# Patient Record
Sex: Female | Born: 1975 | Race: Black or African American | Hispanic: No | Marital: Married | Smoking: Never smoker
Health system: Southern US, Community
[De-identification: ages and names within clinical notes are randomized; demographics above are authoritative.]

## PROBLEM LIST (undated history)

## (undated) DIAGNOSIS — R011 Cardiac murmur, unspecified: Secondary | ICD-10-CM

## (undated) DIAGNOSIS — IMO0001 Reserved for inherently not codable concepts without codable children: Secondary | ICD-10-CM

## (undated) DIAGNOSIS — D869 Sarcoidosis, unspecified: Secondary | ICD-10-CM

## (undated) DIAGNOSIS — I10 Essential (primary) hypertension: Secondary | ICD-10-CM

## (undated) DIAGNOSIS — E039 Hypothyroidism, unspecified: Secondary | ICD-10-CM

## (undated) DIAGNOSIS — E049 Nontoxic goiter, unspecified: Secondary | ICD-10-CM

## (undated) DIAGNOSIS — R896 Abnormal cytological findings in specimens from other organs, systems and tissues: Secondary | ICD-10-CM

## (undated) DIAGNOSIS — D649 Anemia, unspecified: Secondary | ICD-10-CM

## (undated) HISTORY — DX: Anemia, unspecified: D64.9

## (undated) HISTORY — DX: Sarcoidosis, unspecified: D86.9

## (undated) HISTORY — DX: Abnormal cytological findings in specimens from other organs, systems and tissues: R89.6

## (undated) HISTORY — DX: Nontoxic goiter, unspecified: E04.9

## (undated) HISTORY — PX: COLPOSCOPY: SHX161

## (undated) HISTORY — DX: Reserved for inherently not codable concepts without codable children: IMO0001

## (undated) HISTORY — DX: Cardiac murmur, unspecified: R01.1

## (undated) HISTORY — DX: Hypothyroidism, unspecified: E03.9

## (undated) HISTORY — PX: THYROID SURGERY: SHX805

## (undated) HISTORY — PX: TOOTH EXTRACTION: SUR596

---

## 2002-08-04 HISTORY — PX: THYROID SURGERY: SHX805

## 2002-09-04 DIAGNOSIS — C801 Malignant (primary) neoplasm, unspecified: Secondary | ICD-10-CM

## 2002-09-04 HISTORY — DX: Malignant (primary) neoplasm, unspecified: C80.1

## 2002-11-16 ENCOUNTER — Ambulatory Visit (HOSPITAL_COMMUNITY): Admission: RE | Admit: 2002-11-16 | Discharge: 2002-11-16 | Payer: Self-pay | Admitting: Obstetrics and Gynecology

## 2002-11-16 ENCOUNTER — Encounter: Payer: Self-pay | Admitting: Obstetrics and Gynecology

## 2006-12-07 ENCOUNTER — Other Ambulatory Visit: Admission: RE | Admit: 2006-12-07 | Discharge: 2006-12-07 | Payer: Self-pay | Admitting: Gynecology

## 2007-03-04 ENCOUNTER — Emergency Department (HOSPITAL_COMMUNITY): Admission: EM | Admit: 2007-03-04 | Discharge: 2007-03-04 | Payer: Self-pay | Admitting: Emergency Medicine

## 2007-03-11 ENCOUNTER — Ambulatory Visit: Payer: Self-pay | Admitting: Orthopedic Surgery

## 2007-04-08 ENCOUNTER — Ambulatory Visit: Payer: Self-pay | Admitting: Orthopedic Surgery

## 2007-04-20 ENCOUNTER — Ambulatory Visit: Payer: Self-pay | Admitting: Orthopedic Surgery

## 2007-04-20 ENCOUNTER — Other Ambulatory Visit: Admission: RE | Admit: 2007-04-20 | Discharge: 2007-04-20 | Payer: Self-pay | Admitting: Gynecology

## 2007-06-03 ENCOUNTER — Ambulatory Visit: Payer: Self-pay | Admitting: Orthopedic Surgery

## 2007-06-03 DIAGNOSIS — S93409A Sprain of unspecified ligament of unspecified ankle, initial encounter: Secondary | ICD-10-CM | POA: Insufficient documentation

## 2007-06-22 ENCOUNTER — Encounter: Payer: Self-pay | Admitting: Orthopedic Surgery

## 2007-10-07 ENCOUNTER — Other Ambulatory Visit: Admission: RE | Admit: 2007-10-07 | Discharge: 2007-10-07 | Payer: Self-pay | Admitting: Gynecology

## 2008-03-15 ENCOUNTER — Other Ambulatory Visit: Admission: RE | Admit: 2008-03-15 | Discharge: 2008-03-15 | Payer: Self-pay | Admitting: Obstetrics and Gynecology

## 2008-07-22 ENCOUNTER — Inpatient Hospital Stay (HOSPITAL_COMMUNITY): Admission: AD | Admit: 2008-07-22 | Discharge: 2008-07-22 | Payer: Self-pay | Admitting: Obstetrics & Gynecology

## 2008-09-13 ENCOUNTER — Inpatient Hospital Stay (HOSPITAL_COMMUNITY): Admission: AD | Admit: 2008-09-13 | Discharge: 2008-09-13 | Payer: Self-pay | Admitting: Obstetrics & Gynecology

## 2008-09-14 ENCOUNTER — Encounter: Payer: Self-pay | Admitting: Obstetrics & Gynecology

## 2008-09-14 ENCOUNTER — Ambulatory Visit: Payer: Self-pay | Admitting: Family Medicine

## 2008-09-14 ENCOUNTER — Inpatient Hospital Stay (HOSPITAL_COMMUNITY): Admission: AD | Admit: 2008-09-14 | Discharge: 2008-09-17 | Payer: Self-pay | Admitting: Obstetrics & Gynecology

## 2010-11-19 LAB — RPR: RPR Ser Ql: NONREACTIVE

## 2010-11-19 LAB — CBC
HCT: 27.1 % — ABNORMAL LOW (ref 36.0–46.0)
MCHC: 33.6 g/dL (ref 30.0–36.0)
MCV: 87.8 fL (ref 78.0–100.0)
MCV: 88.1 fL (ref 78.0–100.0)
Platelets: 225 10*3/uL (ref 150–400)
RBC: 3.94 MIL/uL (ref 3.87–5.11)
RDW: 15.6 % — ABNORMAL HIGH (ref 11.5–15.5)

## 2010-12-17 NOTE — Discharge Summary (Signed)
Breanna Lucero, Breanna Lucero                ACCOUNT NO.:  [id number]   MEDICAL RECORD NO.:  [id number]          PATIENT TYPE:  INP   LOCATION:  9142                          FACILITY:  [facility]   PHYSICIAN:  Norton Blizzard, MD    DATE OF BIRTH:  July 27, 1976   DATE OF ADMISSION:  09/14/2008  DATE OF DISCHARGE:  09/17/2008                               DISCHARGE SUMMARY   REASON FOR ADMISSION:  Pregnancy at term and labor.   POSTOPERATIVE DIAGNOSES:  Cesarean section for nonreassuring fetal heart  rate pattern, myomectomy for 9-cm uterine fibroids, and lysis of pelvic  adhesions per Dr. Silas Flood and Dr. Noel Gerold on September 14, 2008.   HOSPITAL COURSE:  This has essentially been uneventful.  The patient has  progressed well without complications.  Vital signs have remained  stable.  She is ambulating well, passing flatus, tolerating regular diet  without complications, and voiding without difficulty.   DISCHARGE PHYSICAL EXAMINATION:  VITAL SIGNS:  AFVSS.  HEART:  Regular rhythm and rate.  LUNGS:  Clear to auscultation bilaterally.  ABDOMEN:  Soft and nontender.  Bowel sounds are present x4 quadrants.  Fundus is firm.  Small amount of lochia .  EXTREMITIES:  Trace edema in the lower extremities.   DISCHARGE CONDITION:  Stable on postop day #3.   DISCHARGE MEDICATIONS:  1. Chromagen Forte b.i.d. for hemoglobin of 9.9.  2. Motrin 600 q.6 h. p.r.n. cramping.  3. Lortab 5/325 one p.o. q.4 h. pain.   FOLLOWUP:  She is to follow up on Tuesday at [facility] for staple  removal and  in 6 weeks for postpartum exam.  She desires IUD as  postpartum contraceptive, and this will be placed at her postpartum  visit.      Zerita Boers, N.M.      Norton Blizzard, MD  Electronically Signed   DL/MEDQ  D:  16/05/9603  T:  09/17/2008  Job:  [id number]   cc:   [facility]

## 2010-12-17 NOTE — Op Note (Signed)
Breanna Lucero, Breanna Lucero                ACCOUNT NO.:  [id number]   MEDICAL RECORD NO.:  [id number]          PATIENT TYPE:  INP   LOCATION:  9142                          FACILITY:  [facility]   PHYSICIAN:  Norton Blizzard, MD    DATE OF BIRTH:  08/02/1976   DATE OF PROCEDURE:  09/14/2008  DATE OF DISCHARGE:                               OPERATIVE REPORT   PREOPERATIVE DIAGNOSES:  1. Intrauterine pregnancy at term.  2. History of previous cesarean section.  3. Nonreassuring fetal heart tracing.   POSTOPERATIVE DIAGNOSES:  1. Intrauterine pregnancy at term.  2. History of previous cesarean section.  3. Nonreassuring fetal heart tracing.  4. Partial rupture of posterior uterine wall.  5. A 9-cm uterine fibroid.  6. Pelvic adhesive disease.   PROCEDURES:  1. Repeat low-transverse cesarean section.  2. Myomectomy.   SURGEON:  Norton Blizzard, MD   ASSISTANT:  Odie Sera, DO   ANESTHESIA:  Epidural.   INDICATIONS FOR PROCEDURE:  [name] is a 35 year old now  gravida 4, para 1-2-1-3, who was admitted at 37-5/7th weeks gestational  age in active labor.  She has a history of a previous cesarean section  followed by a successful vaginal birth after cesarean.  She desired to  do a vaginal birth after cesarean.  Her labor progressed.  However, as  she dilated to 6 cm, she was noted to have some moderate vaginal  bleeding.  Shortly thereafter, a prolonged variable deceleration was  noted, it lasted approximately 5 minutes.  The heart rate then recovered  and looked reassuring for a while.  However, as the contractions  progressed, the baby sustained repetitive prolonged decelerations down  to the 50s to 60s that lasted approximately 1-2 minutes with each  contraction.  During this time, there was no further cervical dilation  and after approximately 2 hours at 6 cm, the patient was counseled on a  repeat cesarean section.  The risks and benefits of this procedure were  explained to  the patient to include, but not limited to bleeding,  infection, damage to internal organs, need for additional procedures and  potentially could require a hysterectomy.  The patient voiced  understanding of these instructions and desired to proceed with the  surgery.   DESCRIPTION OF PROCEDURE:  The patient was taken to the operative room  and her epidural anesthesia was re-bolused.  She was then prepped and  draped in the usual sterile manner and placed in the left dorsal supine  position.  A time-out was conducted.  Appropriate anesthesia was  confirmed.  A Pfannenstiel incision was made in the skin and extended  through the subcutaneous fat to the underlying fascia.  The fascia was  then incised in the midline and the fascial incision was extended using  Mayo scissors.  The fascia was then dissected off the underlying rectus  muscles, both bluntly and sharply with the Mayo scissors.  The rectus  muscles were then separated in the midline and the opening was extended  using manual traction.  The peritoneum was entered bluntly and the  peritoneal opening was also extended using manual traction.  The rectus  muscles were further dissected, first bluntly and then sharply in the  midline, and there was some dissection with the Bovie on the left rectus  muscle approximately the mid position one third of the way over  transversely.  The appropriate opening to the uterus was obtained.  The  bladder blade was placed, and a transverse incision was made in the  lower uterine segment with the scalpel.  This incision was carried  through the layers of myometrium with the last layer penetrated bluntly  with a finger.  The uterine incision was extended, a moderate amount of  blood and some blood clot was noted coming from the uterus.  The fetal  head was easily grasped and elevated out of the pelvis.  Bladder blade  was removed, and a fetal head delivered with assistance of a small  amount of  fundal pressure.  The mouth and nares were both bulb  suctioned.  Then the shoulders followed by rest of the corpus were  delivered in the usual manner.  The mouth and nares were both bulb  suctioned again.  The baby had a spontaneous cry.  The cord was clamped  and cut x2, and the baby was handed to the awaiting NICU staff.  The  placenta then delivered spontaneously with the assistance of fundal  massage.  The placenta was intact in the three-vessel cord.  A cord pH  was collected, which was 7.27.  The uterus was then cleared of clots and  debris with a dry lap sponge.  The uterine incision was inspected and  there was found to be a moderate-sized portion of the posterior uterine  wall that had torn from the inside outward, from the endometrium to more  than half of the myometrium was involved.  This partial ruptured area  was sutured back down to the posterior uterine wall with 0 Monocryl  suture.  The uterine incision was then closed in a two-layer closure  using the same suture.  The first layer was a running interlocking  stitch.  The second layer was an imbricating layer.  Good hemostasis was  noted of the uterine incision.  While attempting to palpate the adnexae,  a large uterine mass was noted on the superior aspect of the uterus.  The uterus was then exteriorized and a 9-cm uterine fibroid mass was  noted off the superior-anterior aspect of the uterus.  There was some  thin adhesions involving bowel and the fibroid.  The adhesed bowel was  gently separated using blunt dissection.  There was also two omentum  adhesions which were clamped with Kelly clamps, cut and then suture  ligated with 0 Vicryl.  The stalk of the fibroid was then grasped with  two Kelly clamps and the mass was removed using electrocautery.  Two 0  Vicryl Heaney sutures were placed for hemostasis and the Kelly clamps  were removed.  Some small amount of bleeding persisted and some 2-0  Vicryl was used to over  stitch the serosa.  Good hemostasis was noted  and the uterus returned to the abdomen.  A layer of Surgicel was placed  over the area where the fibroid was removed.  The rectus muscles and  peritoneum was approximated using interrupted sutures of 0 Vicryl.  The  fascia was then closed with 0 PDS suture in a running non-interlocking  fashion.  Good hemostasis was noted.  There were no defects noted  in the  fascia.  The subcutaneous tissues were irrigated with a wet lap sponge.  The skin was then closed using staples in the usual manner.  A pressure  dressing was applied.  The vaginal exam was done, clots removed from the  vagina, and a good passageway into the uterus was noted.   FINDINGS:  1. Viable female infant, Apgars were 9 and 9, cord pH was 7.27.  2. A 9-cm uterine mass  3. Bilateral fallopian tubes and ovaries grossly normal.   SPECIMEN:  1. Placenta.  2. Uterine fibroid.   DISPOSITION OF SPECIMEN:  Pathology.   ESTIMATED BLOOD LOSS:  300 mL.   COMPLICATIONS:  There were no immediate complications.   CONDITION/DISPOSITION:  The patient was taken to the PACU in stable  condition.      Odie Sera, DO  Electronically Signed     ______________________________  Norton Blizzard, MD    MC/MEDQ  D:  09/14/2008  T:  09/15/2008  Job:  [id number]

## 2011-03-03 ENCOUNTER — Encounter: Payer: Self-pay | Admitting: *Deleted

## 2011-03-05 ENCOUNTER — Ambulatory Visit: Payer: Self-pay | Admitting: Gynecology

## 2011-04-08 ENCOUNTER — Other Ambulatory Visit (HOSPITAL_COMMUNITY)
Admission: RE | Admit: 2011-04-08 | Discharge: 2011-04-08 | Disposition: A | Discharge status: Home / Self Care | Payer: 59 | Source: Ambulatory Visit | Attending: Obstetrics & Gynecology | Admitting: Obstetrics & Gynecology

## 2011-04-08 ENCOUNTER — Other Ambulatory Visit: Payer: Self-pay | Admitting: Obstetrics & Gynecology

## 2011-04-08 DIAGNOSIS — Z01419 Encounter for gynecological examination (general) (routine) without abnormal findings: Secondary | ICD-10-CM | POA: Insufficient documentation

## 2011-05-09 LAB — WET PREP, GENITAL

## 2011-09-23 ENCOUNTER — Emergency Department (HOSPITAL_COMMUNITY): Payer: 59

## 2011-09-23 ENCOUNTER — Other Ambulatory Visit: Payer: Self-pay

## 2011-09-23 ENCOUNTER — Emergency Department (HOSPITAL_COMMUNITY)
Admission: EM | Admit: 2011-09-23 | Discharge: 2011-09-23 | Disposition: A | Discharge status: Home / Self Care | Payer: 59 | Attending: Emergency Medicine | Admitting: Emergency Medicine

## 2011-09-23 ENCOUNTER — Encounter (HOSPITAL_COMMUNITY): Payer: Self-pay | Admitting: *Deleted

## 2011-09-23 DIAGNOSIS — R030 Elevated blood-pressure reading, without diagnosis of hypertension: Secondary | ICD-10-CM | POA: Insufficient documentation

## 2011-09-23 DIAGNOSIS — R079 Chest pain, unspecified: Secondary | ICD-10-CM | POA: Insufficient documentation

## 2011-09-23 DIAGNOSIS — G44209 Tension-type headache, unspecified, not intractable: Secondary | ICD-10-CM

## 2011-09-23 DIAGNOSIS — E039 Hypothyroidism, unspecified: Secondary | ICD-10-CM | POA: Insufficient documentation

## 2011-09-23 MED ORDER — METOCLOPRAMIDE HCL 5 MG/ML IJ SOLN
10.0000 mg | Freq: Once | INTRAMUSCULAR | Status: AC
  Administered 2011-09-23: 10 mg via INTRAVENOUS
  Filled 2011-09-23: qty 2

## 2011-09-23 MED ORDER — METOCLOPRAMIDE HCL 10 MG PO TABS: 10.0000 mg | ORAL_TABLET | Freq: Four times a day (QID) | ORAL | Status: DC | PRN

## 2011-09-23 MED ORDER — DIPHENHYDRAMINE HCL 50 MG/ML IJ SOLN
25.0000 mg | Freq: Once | INTRAMUSCULAR | Status: AC
  Administered 2011-09-23: 03:00:00 via INTRAVENOUS
  Filled 2011-09-23: qty 1

## 2011-09-23 MED ORDER — SODIUM CHLORIDE 0.9 % IV BOLUS (SEPSIS)
1000.0000 mL | Freq: Once | INTRAVENOUS | Status: AC
  Administered 2011-09-23: 1000 mL via INTRAVENOUS

## 2011-09-23 NOTE — ED Provider Notes (Signed)
History     CSN: [id number]  Arrival date & time 09/23/11  1324   First MD Initiated Contact with Patient 09/23/11 0232      Chief Complaint  Patient presents with  . Headache  . Hypertension  . Nausea    (Consider location/radiation/quality/duration/timing/severity/associated sxs/prior treatment) Patient is a 36 y.o. female presenting with headaches and hypertension. The history is provided by the patient.  Headache   Hypertension Associated symptoms include headaches.  She started having a left occipital headache this evening. The pain was dull of but severe and she rated it at 10 out of 10. She took a dose of naproxen and that reduced the pain to 6/10. There is no associated nausea, vomiting. She denies photophobia or phonophobia. Pain was worse if she turned her head a certain way. She took her blood pressure and it was elevated at 170/110. She states her blood pressures normally not elevated. She denies tinnitus. She denies visual changes. She also is concerned that she's had a head cold and she's been sneezing a lot and when she sneezes, there is a burning sensation that goes through her chest. She denies dyspnea and denies fever.  Past Medical History  Diagnosis Date  . Hypothyroid   . Goiter   . ASCUS (atypical squamous cells of undetermined significance) on Pap smear     neg high risk hpv    Past Surgical History  Procedure Date  . Cesarean section   . Thyroid surgery   . Colposcopy     Family History  Problem Relation Age of Onset  . Hypertension Mother   . Heart disease Father     History  Substance Use Topics  . Smoking status: Never Smoker   . Smokeless tobacco: Not on file  . Alcohol Use: No    OB History    Grav Para Term Preterm Abortions TAB SAB Ect Mult Living                  Review of Systems  Neurological: Positive for headaches.  All other systems reviewed and are negative.    Allergies  Review of patient's allergies indicates no  known allergies.  Home Medications   Current Outpatient Rx  Name Route Sig Dispense Refill  . IBUPROFEN 400 MG PO TABS Oral Take 400 mg by mouth every 6 (six) hours as needed. As needed    . SYNTHROID PO Oral Take 200 mg by mouth daily.       BP 154/88  Pulse 72  Temp(Src) 97.9 F (36.6 C) (Oral)  Resp 18  Ht 5\' 4"  (1.626 m)  Wt 205 lb (92.987 kg)  BMI 35.19 kg/m2  SpO2 100%  Physical Exam  Nursing note and vitals reviewed.  36 year old female is resting comfortably in no acute distress. Vital signs are significant for mild hypertension with blood pressure 154/88. Oxygen saturation is 100% which is normal. Head is normocephalic and atraumatic. PERRLA, EOMI. Fundi show no hemorrhage or exudate or papilledema. Oropharynx is clear. There is tenderness to palpation at the insertion of the left paracervical muscles which does reproduce her headache. Neck is supple and there is no adenopathy. Back is nontender. Lungs are clear without rales, wheezes, or rhonchi. Heart has regular rate rhythm without murmur. Abdomen is soft, flat, nontender without masses or hepatosplenomegaly. Extremities have no cyanosis or edema, full range of motion is present. Skin is warm and dry without rash. Neurologic: Mental status is normal, cranial nerves are intact, there no  focal motor or sensory deficits.  ED Course  Procedures (including critical care time)  Dg Chest 2 View  09/23/2011  *RADIOLOGY REPORT*  Clinical Data: Chest pain; sneezing.  Hypertension.  CHEST - 2 VIEW  Comparison: None.  Findings: The lungs are well-aerated and clear.  There is no evidence of focal opacification, pleural effusion or pneumothorax.  The heart is borderline normal in size; the mediastinal contour is within normal limits.  No acute osseous abnormalities are seen.  IMPRESSION: No acute cardiopulmonary process seen.  Original Report Authenticated By: Tonia Ghent, M.D.    ECG shows normal sinus rhythm with a rate of 74, no  ectopy. Normal axis. Normal P wave. Normal QRS. Normal intervals. Normal ST and T waves. Impression: normal ECG. No prior ECG available for comparison.  She had good relief of her headache with IV fluids, IV metoclopramide, and IV diphenhydramine.  1. Muscle contraction headache       MDM  Headache is most likely muscle contraction headache. Hypertension is of uncertain significance it may just be a response to pain. She will be given a headache cocktail and reassess. She's concerned about her chest pain which I do believe this is mucosal irritation, but chest x-ray and EKG will be obtained.        Dione Booze, MD 09/23/11 [phone]

## 2011-09-23 NOTE — ED Notes (Signed)
pt reports ha & nausea & states blood pressure is up also.

## 2011-09-23 NOTE — Discharge Instructions (Signed)
Check your blood pressure several times over the next week. You did is staying elevated, then talk with your doctor about whether you need to be on medication for your blood pressure. If it comes back to normal, then just have it checked periodically to make sure it's not getting high.  Tension Headache (Muscle Contraction Headache) Tension headache is one of the most common causes of head pain. These headaches are usually felt as a pain over the top of your head and back of your neck. Stress, anxiety, and depression are common triggers for these headaches. Tension headaches are not life-threatening and will not lead to other types of headaches. Tension headaches can often be diagnosed by taking a history from the patient and a physical exam. Sometimes, further lab and x-ray studies are used to confirm the diagnosis. Your caregiver can advise you on how to get help solving problems that cause anxiety or stress. Antidepressants can be prescribed if depression is a problem. HOME CARE INSTRUCTIONS   If testing was done, call for your results. Remember, it is your responsibility to get the results of all testing. Do not assume everything is fine because you do not hear from your caregiver.   Only take over-the-counter or prescription medicines for pain, discomfort, or fever as directed by your caregiver.   Biofeedback, massage, or other relaxation techniques may be helpful.   Ice packs or heat to the head and neck can be used. Use these three to four times per day or as needed.   Physical therapy may be a useful addition to treatment.   If headaches continue, even with therapy, you may need to think about lifestyle changes.   Avoid excessive use of pain killers, as rebound headaches can occur.  SEEK MEDICAL CARE IF:   You develop problems with medications prescribed.   You do not respond or get no relief from medications.   You have a change from the usual headache.   You develop nausea  (feeling sick to your stomach) or vomiting.  SEEK IMMEDIATE MEDICAL CARE IF:   Your headache becomes severe.   You have an unexplained oral temperature above 102 F (38.9 C).   You develop a stiff neck.   You have loss of vision.   You have muscular weakness.   You have loss of muscular control.   You develop severe symptoms different from your first symptoms.   You start losing your balance or have trouble walking.   You feel faint or pass out.  MAKE SURE YOU:   Understand these instructions.   Will watch your condition.   Will get help right away if you are not doing well or get worse.  Document Released: 07/21/2005 Document Revised: 04/02/2011 Document Reviewed: 03/09/2008 [facility] Patient Information 2012 [place], [place].  Metoclopramide tablets What is this medicine? METOCLOPRAMIDE (met oh kloe PRA mide) is used to treat the symptoms of gastroesophageal reflux disease (GERD) like heartburn. It is also used to treat people with slow emptying of the stomach and intestinal tract. This medicine may be used for other purposes; ask your health care provider or pharmacist if you have questions. What should I tell my health care provider before I take this medicine? They need to know if you have any of these conditions: -breast cancer -depression -diabetes -heart failure -high blood pressure -kidney disease -liver disease -Parkinson's disease or a movement disorder -pheochromocytoma -seizures -stomach obstruction, bleeding, or perforation -an unusual or allergic reaction to metoclopramide, procainamide, sulfites, other medicines,  foods, dyes, or preservatives -pregnant or trying to get pregnant -breast-feeding How should I use this medicine? Take this medicine by mouth with a glass of water. Follow the directions on the prescription label. Take this medicine on an empty stomach, about 30 minutes before eating. Take your doses at regular intervals. Do not take your  medicine more often than directed. Do not stop taking except on the advice of your doctor or health care professional. A special MedGuide will be given to you by the pharmacist with each prescription and refill. Be sure to read this information carefully each time. Talk to your pediatrician regarding the use of this medicine in children. Special care may be needed. Overdosage: If you think you have taken too much of this medicine contact a poison control center or emergency room at once. NOTE: This medicine is only for you. Do not share this medicine with others. What if I miss a dose? If you miss a dose, take it as soon as you can. If it is almost time for your next dose, take only that dose. Do not take double or extra doses. What may interact with this medicine? -acetaminophen -cyclosporine -digoxin -medicines for blood pressure -medicines for diabetes, including insulin -medicines for hay fever and other allergies -medicines for depression, especially an Monoamine Oxidase Inhibitor (MAOI) -medicines for Parkinson's disease, like levodopa -medicines for sleep or for pain -tetracycline This list may not describe all possible interactions. Give your health care provider a list of all the medicines, herbs, non-prescription drugs, or dietary supplements you use. Also tell them if you smoke, drink alcohol, or use illegal drugs. Some items may interact with your medicine. What should I watch for while using this medicine? It may take a few weeks for your stomach condition to start to get better. However, do not take this medicine for longer than 12 weeks. The longer you take this medicine, and the more you take it, the greater your chances are of developing serious side effects. If you are an elderly patient, a female patient, or you have diabetes, you may be at an increased risk for side effects from this medicine. Contact your doctor immediately if you start having movements you cannot control  such as lip smacking, rapid movements of the tongue, involuntary or uncontrollable movements of the eyes, head, arms and legs, or muscle twitches and spasms. Patients and their families should watch out for worsening depression or thoughts of suicide. Also watch out for any sudden or severe changes in feelings such as feeling anxious, agitated, panicky, irritable, hostile, aggressive, impulsive, severely restless, overly excited and hyperactive, or not being able to sleep. If this happens, especially at the beginning of treatment or after a change in dose, call your doctor. Do not treat yourself for high fever. Ask your doctor or health care professional for advice. You may get drowsy or dizzy. Do not drive, use machinery, or do anything that needs mental alertness until you know how this drug affects you. Do not stand or sit up quickly, especially if you are an older patient. This reduces the risk of dizzy or fainting spells. Alcohol can make you more drowsy and dizzy. Avoid alcoholic drinks. What side effects may I notice from receiving this medicine? Side effects that you should report to your doctor or health care professional as soon as possible: -allergic reactions like skin rash, itching or hives, swelling of the face, lips, or tongue -abnormal production of milk in females -breast enlargement in both  males and females -change in the way you walk -difficulty moving, speaking or swallowing -drooling, lip smacking, or rapid movements of the tongue -excessive sweating -fever -involuntary or uncontrollable movements of the eyes, head, arms and legs -irregular heartbeat or palpitations -muscle twitches and spasms -unusually weak or tired Side effects that usually do not require medical attention (report to your doctor or health care professional if they continue or are bothersome): -change in sex drive or performance -depressed mood -diarrhea -difficulty sleeping -headache -menstrual  changes -restless or nervous This list may not describe all possible side effects. Call your doctor for medical advice about side effects. You may report side effects to FDA at 1-800-FDA-1088. Where should I keep my medicine? Keep out of the reach of children. Store at room temperature between 20 and 25 degrees C (68 and 77 degrees F). Protect from light. Keep container tightly closed. Throw away any unused medicine after the expiration date. NOTE: This sheet is a summary. It may not cover all possible information. If you have questions about this medicine, talk to your doctor, pharmacist, or health care provider.  2012, [employer]. (03/15/2008 4:30:05 PM)

## 2012-06-28 ENCOUNTER — Other Ambulatory Visit: Payer: Self-pay | Admitting: Adult Health

## 2012-06-28 ENCOUNTER — Other Ambulatory Visit (HOSPITAL_COMMUNITY)
Admission: RE | Admit: 2012-06-28 | Discharge: 2012-06-28 | Disposition: A | Discharge status: Home / Self Care | Payer: 59 | Source: Ambulatory Visit | Attending: Obstetrics and Gynecology | Admitting: Obstetrics and Gynecology

## 2012-06-28 DIAGNOSIS — Z1151 Encounter for screening for human papillomavirus (HPV): Secondary | ICD-10-CM | POA: Insufficient documentation

## 2012-06-28 DIAGNOSIS — Z01419 Encounter for gynecological examination (general) (routine) without abnormal findings: Secondary | ICD-10-CM | POA: Insufficient documentation

## 2012-11-11 ENCOUNTER — Encounter: Payer: Self-pay | Admitting: *Deleted

## 2012-11-11 DIAGNOSIS — D649 Anemia, unspecified: Secondary | ICD-10-CM

## 2012-11-11 DIAGNOSIS — E039 Hypothyroidism, unspecified: Secondary | ICD-10-CM

## 2012-11-11 DIAGNOSIS — Z87898 Personal history of other specified conditions: Secondary | ICD-10-CM

## 2012-11-11 DIAGNOSIS — R011 Cardiac murmur, unspecified: Secondary | ICD-10-CM

## 2012-11-12 ENCOUNTER — Ambulatory Visit (INDEPENDENT_AMBULATORY_CARE_PROVIDER_SITE_OTHER): Payer: 59 | Admitting: Obstetrics & Gynecology

## 2012-11-12 ENCOUNTER — Encounter: Payer: Self-pay | Admitting: Obstetrics & Gynecology

## 2012-11-12 VITALS — BP 132/92 | Ht 64.0 in | Wt 190.0 lb

## 2012-11-12 DIAGNOSIS — N939 Abnormal uterine and vaginal bleeding, unspecified: Secondary | ICD-10-CM

## 2012-11-12 DIAGNOSIS — N949 Unspecified condition associated with female genital organs and menstrual cycle: Secondary | ICD-10-CM

## 2012-11-12 MED ORDER — MEGESTROL ACETATE 40 MG PO TABS: 40.0000 mg | ORAL_TABLET | Freq: Every day | ORAL | Status: DC

## 2012-11-12 NOTE — Patient Instructions (Signed)
Dysfunctional Uterine Bleeding  Normally, menstrual periods begin between ages 11 to 17 in young women. A normal menstrual cycle/period may begin every 23 days up to 35 days and lasts from 1 to 7 days. Around 12 to 14 days before your menstrual period starts, ovulation (ovary produces an egg) occurs. When counting the time between menstrual periods, count from the first day of bleeding of the previous period to the first day of bleeding of the next period.  Dysfunctional (abnormal) uterine bleeding is bleeding that is different from a normal menstrual period. Your periods may come earlier or later than usual. They may be lighter, have blood clots or be heavier. You may have bleeding between periods, or you may skip one period or more. You may have bleeding after sexual intercourse, bleeding after menopause, or no menstrual period.  CAUSES   · Pregnancy (normal, miscarriage, tubal).  · IUDs (intrauterine device, birth control).  · Birth control pills.  · Hormone treatment.  · Menopause.  · Infection of the cervix.  · Blood clotting problems.  · Infection of the inside lining of the uterus.  · Endometriosis, inside lining of the uterus growing in the pelvis and other female organs.  · Adhesions (scar tissue) inside the uterus.  · Obesity or severe weight loss.  · Uterine polyps inside the uterus.  · Cancer of the vagina, cervix, or uterus.  · Ovarian cysts or polycystic ovary syndrome.  · Medical problems (diabetes, thyroid disease).  · Uterine fibroids (noncancerous tumor).  · Problems with your female hormones.  · Endometrial hyperplasia, very thick lining and enlarged cells inside of the uterus.  · Medicines that interfere with ovulation.  · Radiation to the pelvis or abdomen.  · Chemotherapy.  DIAGNOSIS   · Your doctor will discuss the history of your menstrual periods, medicines you are taking, changes in your weight, stress in your life, and any medical problems you may have.  · Your doctor will do a physical  and pelvic examination.  · Your doctor may want to perform certain tests to make a diagnosis, such as:  · Pap test.  · Blood tests.  · Cultures for infection.  · CT scan.  · Ultrasound.  · Hysteroscopy.  · Laparoscopy.  · MRI.  · Hysterosalpingography.  · D and C.  · Endometrial biopsy.  TREATMENT   Treatment will depend on the cause of the dysfunctional uterine bleeding (DUB). Treatment may include:  · Observing your menstrual periods for a couple of months.  · Prescribing medicines for medical problems, including:  · Antibiotics.  · Hormones.  · Birth control pills.  · Removing an IUD (intrauterine device, birth control).  · Surgery:  · D and C (scrape and remove tissue from inside the uterus).  · Laparoscopy (examine inside the abdomen with a lighted tube).  · Uterine ablation (destroy lining of the uterus with electrical current, laser, heat, or freezing).  · Hysteroscopy (examine cervix and uterus with a lighted tube).  · Hysterectomy (remove the uterus).  HOME CARE INSTRUCTIONS   · If medicines were prescribed, take exactly as directed. Do not change or switch medicines without consulting your caregiver.  · Long term heavy bleeding may result in iron deficiency. Your caregiver may have prescribed iron pills. They help replace the iron that your body lost from heavy bleeding. Take exactly as directed.  · Do not take aspirin or medicines that contain aspirin one week before or during your menstrual period. Aspirin may make   the bleeding worse.  · If you need to change your sanitary pad or tampon more than once every 2 hours, stay in bed with your feet elevated and a cold pack on your lower abdomen. Rest as much as possible, until the bleeding stops or slows down.  · Eat well-balanced meals. Eat foods high in iron. Examples are:  · Leafy green vegetables.  · Whole-grain breads and cereals.  · Eggs.  · Meat.  · Liver.  · Do not try to lose weight until the abnormal bleeding has stopped and your blood iron level is  back to normal. Do not lift more than ten pounds or do strenuous activities when you are bleeding.  · For a couple of months, make note on your calendar, marking the start and ending of your period, and the type of bleeding (light, medium, heavy, spotting, clots or missed periods). This is for your caregiver to better evaluate your problem.  SEEK MEDICAL CARE IF:   · You develop nausea (feeling sick to your stomach) and vomiting, dizziness, or diarrhea while you are taking your medicine.  · You are getting lightheaded or weak.  · You have any problems that may be related to the medicine you are taking.  · You develop pain with your DUB.  · You want to remove your IUD.  · You want to stop or change your birth control pills or hormones.  · You have any type of abnormal bleeding mentioned above.  · You are over 16 years old and have not had a menstrual period yet.  · You are 37 years old and you are still having menstrual periods.  · You have any of the symptoms mentioned above.  · You develop a rash.  SEEK IMMEDIATE MEDICAL CARE IF:   · An oral temperature above 102° F (38.9° C) develops.  · You develop chills.  · You are changing your sanitary pad or tampon more than once an hour.  · You develop abdominal pain.  · You pass out or faint.  Document Released: 07/18/2000 Document Revised: 10/13/2011 Document Reviewed: 06/19/2009  [employer]® Patient Information ©2013 [employer].

## 2012-11-12 NOTE — Progress Notes (Signed)
Patient ID: Breanna Lucero, female   DOB: 1976-05-21, 37 y.o.   MRN: [id number] Patient has Nexplanon in and has been bleeding for 3 weeks.  Sometimes dark and now heavy and red with clots.  Did not do it with first Implanon  Exam Blood in vault cervix normal uterus is N[facility] non tender  IMP  DUB on Nexplanon  Plan Megace algorithm Follow up prn

## 2013-01-17 ENCOUNTER — Telehealth: Payer: Self-pay | Admitting: Obstetrics & Gynecology

## 2013-01-17 NOTE — Telephone Encounter (Signed)
Spoke with pt, she stated that since she had her Implanon replaced she has been bleeding. Pt stated she has used megace in the past and the bleeding only stopped for one week. Pt stated she is now having really heavy bleeding and clotting. I advised the pt to make an appointment for this week to be seen by a provider to discuss this problem. Pt was given an appointment.

## 2013-01-20 ENCOUNTER — Ambulatory Visit: Payer: 59 | Admitting: Obstetrics & Gynecology

## 2013-11-21 IMAGING — CR DG CHEST 2V
2 series · 2 of 2 positions shown · non-contrast
Comparison: None.

CLINICAL DATA: Chest pain; sneezing.  Hypertension.

CHEST - 2 VIEW

[view not recorded (1 of 2)]
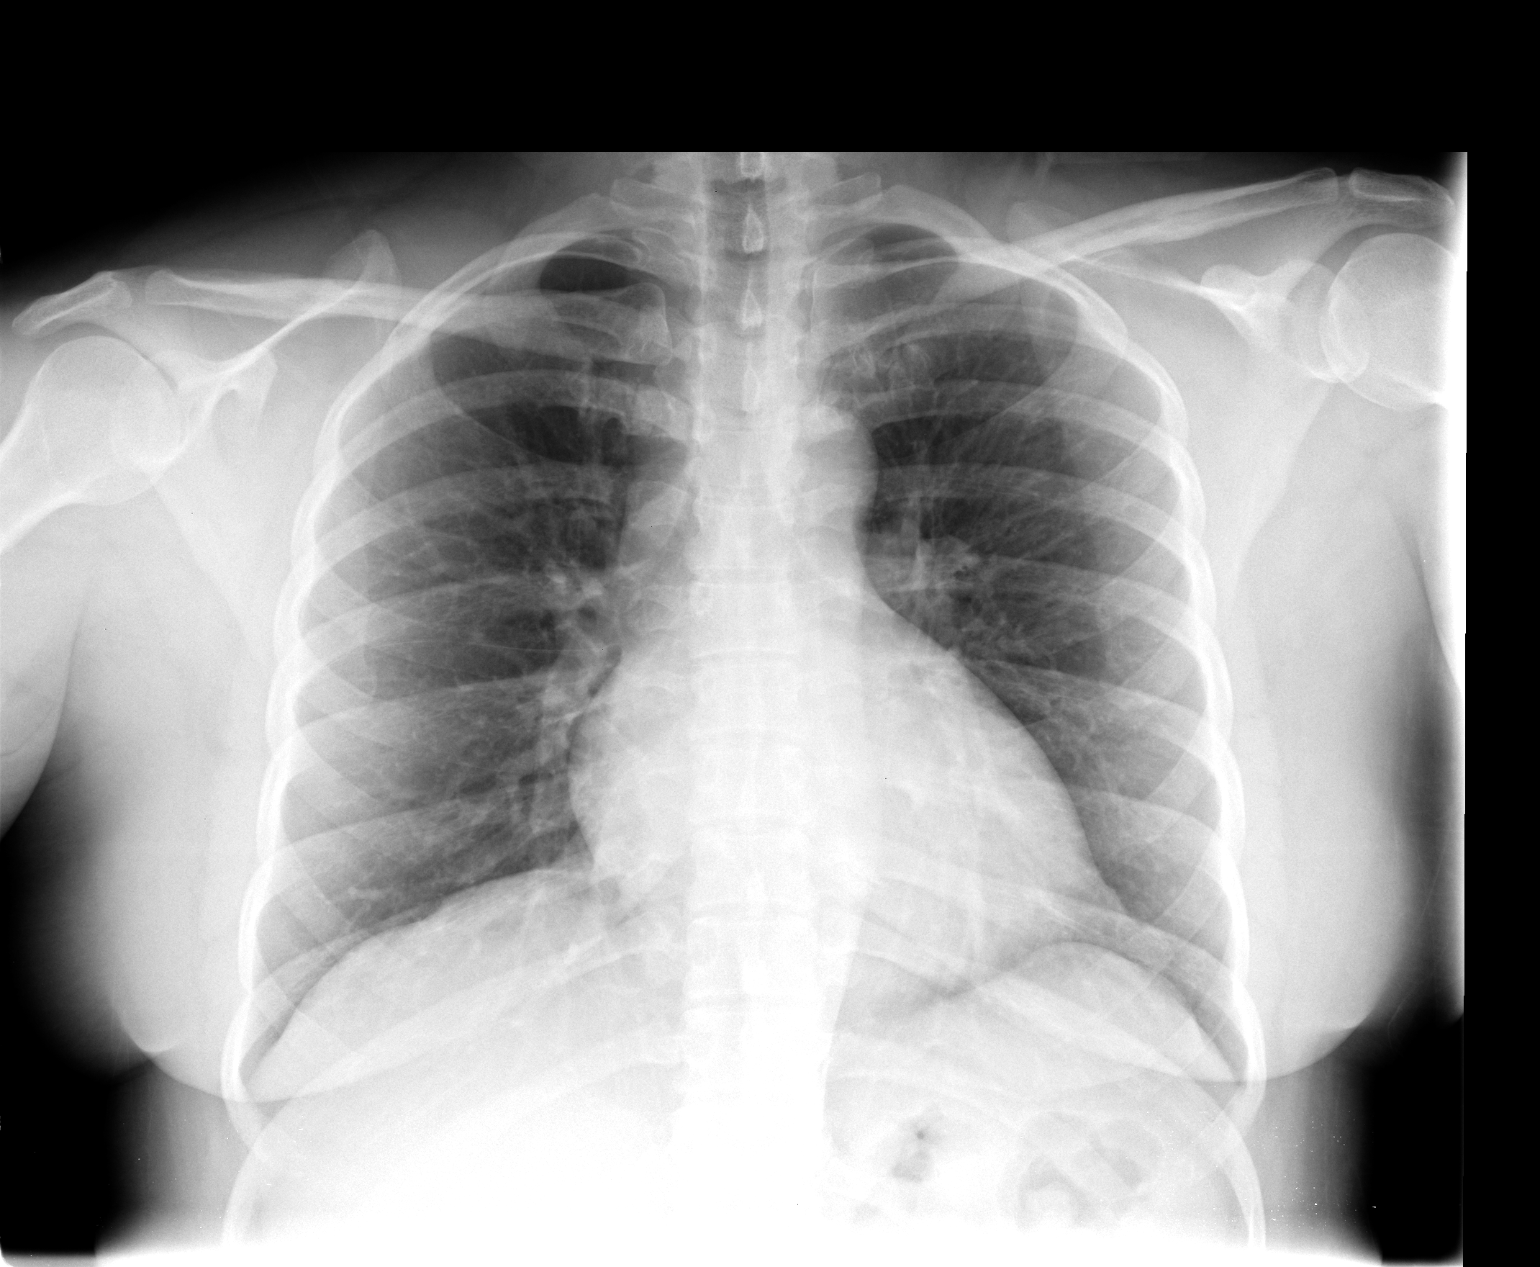

[view not recorded (2 of 2)]
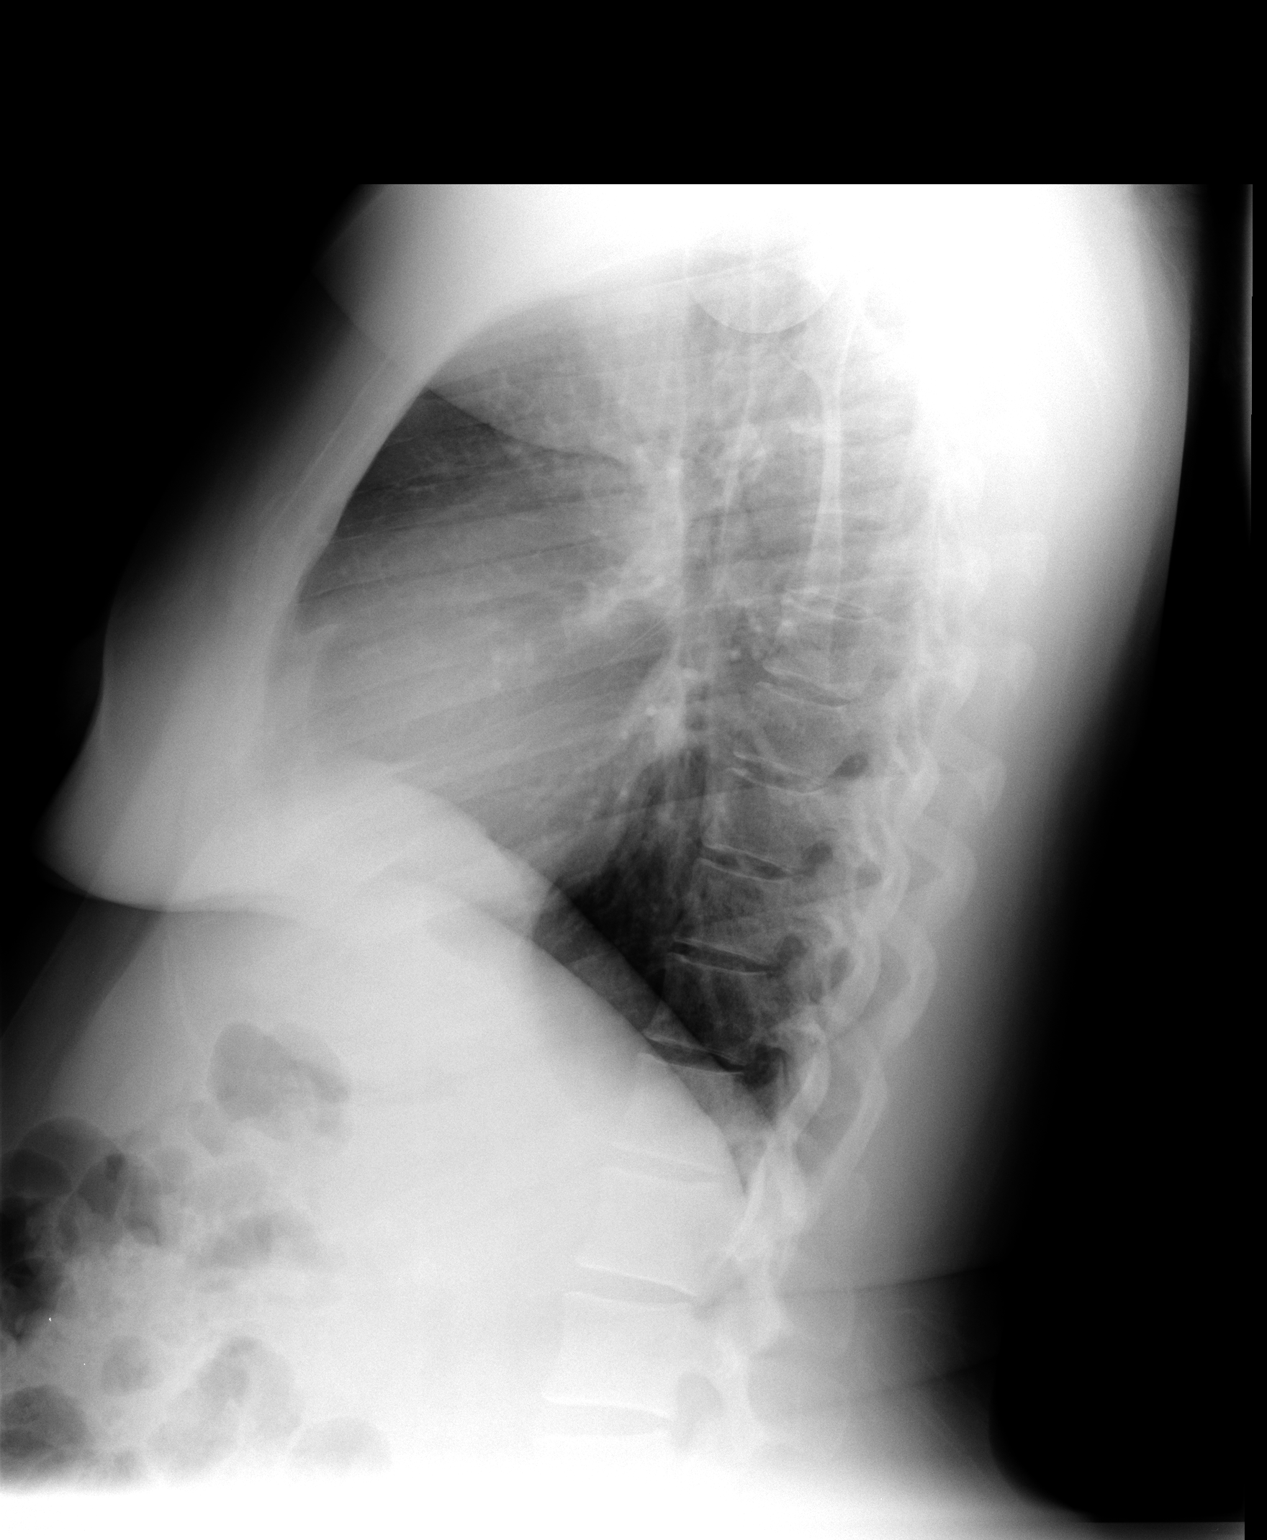

[2 of 2 positions shown; findings below may reference images not displayed]

FINDINGS: The lungs are well-aerated and clear.  There is no
evidence of focal opacification, pleural effusion or pneumothorax.

The heart is borderline normal in size; the mediastinal contour is
within normal limits.  No acute osseous abnormalities are seen.
IMPRESSION: No acute cardiopulmonary process seen.

## 2014-03-24 ENCOUNTER — Other Ambulatory Visit: Payer: 59 | Admitting: Obstetrics & Gynecology

## 2014-06-05 ENCOUNTER — Encounter: Payer: Self-pay | Admitting: Obstetrics & Gynecology

## 2014-06-21 ENCOUNTER — Ambulatory Visit (INDEPENDENT_AMBULATORY_CARE_PROVIDER_SITE_OTHER): Payer: 59 | Admitting: Obstetrics & Gynecology

## 2014-06-21 ENCOUNTER — Encounter: Payer: Self-pay | Admitting: Obstetrics & Gynecology

## 2014-06-21 ENCOUNTER — Other Ambulatory Visit (HOSPITAL_COMMUNITY)
Admission: RE | Admit: 2014-06-21 | Discharge: 2014-06-21 | Disposition: A | Discharge status: Home / Self Care | Payer: 59 | Source: Ambulatory Visit | Attending: Obstetrics & Gynecology | Admitting: Obstetrics & Gynecology

## 2014-06-21 VITALS — BP 130/80 | Ht 64.75 in | Wt 202.0 lb

## 2014-06-21 DIAGNOSIS — Z01419 Encounter for gynecological examination (general) (routine) without abnormal findings: Secondary | ICD-10-CM | POA: Diagnosis not present

## 2014-06-21 NOTE — Progress Notes (Signed)
Patient ID: Breanna Lucero, female   DOB: 1975-12-22, 38 y.o.   MRN: [id number] Subjective:     Breanna Lucero is a 38 y.o. female here for a routine exam.  No LMP recorded. Patient has had an implant. [id number] Birth Control Method:  Nexplanon, 07/2012 Menstrual Calendar(currently): irregular  Current complaints: none.   Current acute medical issues:  none   Recent Gynecologic History No LMP recorded. Patient has had an implant. Last Pap: 2013,  normal Last mammogram: ,    Past Medical History  Diagnosis Date  . Hypothyroid   . Goiter   . ASCUS (atypical squamous cells of undetermined significance) on Pap smear     neg high risk hpv  . Heart murmur     as a child  . Anemia     menorrhagia induced  . Hypothyroid     Past Surgical History  Procedure Laterality Date  . Thyroid surgery    . Colposcopy    . Cesarean section      x 2    OB History    Gravida Para Term Preterm AB TAB SAB Ectopic Multiple Living   4 3 1  1  1          History   Social History  . Marital Status: Married    Spouse Name: N/A    Number of Children: N/A  . Years of Education: N/A   Social History Main Topics  . Smoking status: Never Smoker   . Smokeless tobacco: Never Used  . Alcohol Use: No  . Drug Use: No  . Sexual Activity: Yes    Birth Control/ Protection: Implant   Other Topics Concern  . None   Social History Narrative    Family History  Problem Relation Age of Onset  . Heart disease Father     Current outpatient prescriptions: Levothyroxine Sodium (SYNTHROID PO), Take 200 mg by mouth daily. , Disp: , Rfl: ;  cholecalciferol (VITAMIN D) 1000 UNITS tablet, Take 1,000 Units by mouth once a week., Disp: , Rfl: ;  ibuprofen (ADVIL,MOTRIN) 400 MG tablet, Take 400 mg by mouth every 6 (six) hours as needed. As needed, Disp: , Rfl: ;  megestrol (MEGACE) 40 MG tablet, Take 1 tablet (40 mg total) by mouth daily., Disp: 50 tablet, Rfl: 0  Review of Systems  Review of Systems   Constitutional: Negative for fever, chills, weight loss, malaise/fatigue and diaphoresis.  HENT: Negative for hearing loss, ear pain, nosebleeds, congestion, sore throat, neck pain, tinnitus and ear discharge.   Eyes: Negative for blurred vision, double vision, photophobia, pain, discharge and redness.  Respiratory: Negative for cough, hemoptysis, sputum production, shortness of breath, wheezing and stridor.   Cardiovascular: Negative for chest pain, palpitations, orthopnea, claudication, leg swelling and PND.  Gastrointestinal: negative for abdominal pain. Negative for heartburn, nausea, vomiting, diarrhea, constipation, blood in stool and melena.  Genitourinary: Negative for dysuria, urgency, frequency, hematuria and flank pain.  Musculoskeletal: Negative for myalgias, back pain, joint pain and falls.  Skin: Negative for itching and rash.  Neurological: Negative for dizziness, tingling, tremors, sensory change, speech change, focal weakness, seizures, loss of consciousness, weakness and headaches.  Endo/Heme/Allergies: Negative for environmental allergies and polydipsia. Does not bruise/bleed easily.  Psychiatric/Behavioral: Negative for depression, suicidal ideas, hallucinations, memory loss and substance abuse. The patient is not nervous/anxious and does not have insomnia.        Objective:  Blood pressure 130/80, height 5' 4.75" (1.645 m), weight 202 lb (91.627  kg).   Physical Exam  Vitals reviewed. Constitutional: She is oriented to person, place, and time. She appears well-developed and well-nourished.  HENT:  Head: Normocephalic and atraumatic.        Right Ear: External ear normal.  Left Ear: External ear normal.  Nose: Nose normal.  Mouth/Throat: Oropharynx is clear and moist.  Eyes: Conjunctivae and EOM are normal. Pupils are equal, round, and reactive to light. Right eye exhibits no discharge. Left eye exhibits no discharge. No scleral icterus.  Neck: Normal range of motion.  Neck supple. No tracheal deviation present. No thyromegaly present.  Cardiovascular: Normal rate, regular rhythm, normal heart sounds and intact distal pulses.  Exam reveals no gallop and no friction rub.   No murmur heard. Respiratory: Effort normal and breath sounds normal. No respiratory distress. She has no wheezes. She has no rales. She exhibits no tenderness.  GI: Soft. Bowel sounds are normal. She exhibits no distension and no mass. There is no tenderness. There is no rebound and no guarding.  Genitourinary:  Breasts no masses skin changes or nipple changes bilaterally      Vulva is normal without lesions Vagina is pink moist without discharge Cervix normal in appearance and pap is done Uterus is slightly enlarged 8 weeks size Adnexa is negative with normal sized ovaries   Musculoskeletal: Normal range of motion. She exhibits no edema and no tenderness.  Neurological: She is alert and oriented to person, place, and time. She has normal reflexes. She displays normal reflexes. No cranial nerve deficit. She exhibits normal muscle tone. Coordination normal.  Skin: Skin is warm and dry. No rash noted. No erythema. No pallor.  Psychiatric: She has a normal mood and affect. Her behavior is normal. Judgment and thought content normal.       Assessment:    Healthy female exam.    Plan:    Contraception: Nexplanon. Follow up in: 1 year.

## 2014-06-27 LAB — CYTOLOGY - PAP

## 2015-04-25 ENCOUNTER — Encounter: Payer: Self-pay | Admitting: Advanced Practice Midwife

## 2015-04-25 ENCOUNTER — Ambulatory Visit (INDEPENDENT_AMBULATORY_CARE_PROVIDER_SITE_OTHER): Payer: 59 | Admitting: Advanced Practice Midwife

## 2015-04-25 VITALS — BP 160/100 | HR 80

## 2015-04-25 DIAGNOSIS — N921 Excessive and frequent menstruation with irregular cycle: Secondary | ICD-10-CM | POA: Diagnosis not present

## 2015-04-25 DIAGNOSIS — Z975 Presence of (intrauterine) contraceptive device: Secondary | ICD-10-CM | POA: Diagnosis not present

## 2015-04-25 MED ORDER — MEGESTROL ACETATE 40 MG PO TABS: ORAL_TABLET | ORAL | Status: DC

## 2015-04-25 NOTE — Progress Notes (Signed)
   [facility] Visit  Patient name: Breanna Lucero MRN [id number]  Date of birth: 07-29-1976  CC & HPI:  Breanna Lucero is a 39 y.o. African American female presenting today for BTB on Nexplanon.  She is due to have it removed in December, and has had occ BTB.  For the past week or so, it has been heavy. Has had to take Megace in the past (with success), wants to try that again.  Hgb 13.4 Has never, ever had high BP.   Pertinent History Reviewed:  Medical & Surgical Hx:   Past Medical History  Diagnosis Date  . Hypothyroid   . Goiter   . ASCUS (atypical squamous cells of undetermined significance) on Pap smear     neg high risk hpv  . Heart murmur     as a child  . Anemia     menorrhagia induced  . Hypothyroid    Past Surgical History  Procedure Laterality Date  . Thyroid surgery    . Colposcopy    . Cesarean section      x 2   Family History  Problem Relation Age of Onset  . Heart disease Father     Current outpatient prescriptions:  .  ibuprofen (ADVIL,MOTRIN) 400 MG tablet, Take 400 mg by mouth every 6 (six) hours as needed. As needed, Disp: , Rfl:  .  Levothyroxine Sodium (SYNTHROID PO), Take 200 mg by mouth daily. , Disp: , Rfl:  .  megestrol (MEGACE) 40 MG tablet, Take 3/day (at the same time) for 5 days; 2/day for 5 days, then 1/day PO prn bleeding, Disp: 60 tablet, Rfl: 3 Social History: Reviewed -  reports that she has never smoked. She has never used smokeless tobacco.  Review of Systems:   Constitutional: Negative for fever and chills Eyes: Negative for visual disturbances Respiratory: Negative for shortness of breath, dyspnea Cardiovascular: Negative for chest pain or palpitations  Gastrointestinal: Negative for vomiting, diarrhea and constipation; no abdominal pain Genitourinary: Negative for dysuria and urgency, vaginal irritation or itching Musculoskeletal: Negative for back pain, joint pain, myalgias  Neurological: Negative for dizziness and  headaches    Objective Findings:  Vitals: BP 160/100 mmHg  Pulse 80  Physical Examination: General appearance - alert, well appearing, and in no distress Mental status - alert, oriented to person, place, and time Heart - normal rate and regular rhythm Abdomen - soft, nontender Musculoskeletal - full range of motion without pain Extremities - no pedal edema noted Chest:  Normal respiratory effort  No results found for this or any previous visit (from the past 24 hour(s)).       Assessment & Plan:  A:   BTB on Nexplanon P:  Megace algorhythm. If doesn't respond, will check pelvic US   Can check BP at home--keep me posted if elevated.    CRESENZO-DISHMAN,FRANCES CNM 04/25/2015 4:20 PM

## 2015-06-26 ENCOUNTER — Encounter: Payer: Self-pay | Admitting: Obstetrics & Gynecology

## 2015-06-26 ENCOUNTER — Other Ambulatory Visit (HOSPITAL_COMMUNITY)
Admission: RE | Admit: 2015-06-26 | Discharge: 2015-06-26 | Disposition: A | Discharge status: Home / Self Care | Payer: 59 | Source: Ambulatory Visit | Attending: Obstetrics & Gynecology | Admitting: Obstetrics & Gynecology

## 2015-06-26 ENCOUNTER — Ambulatory Visit (INDEPENDENT_AMBULATORY_CARE_PROVIDER_SITE_OTHER): Payer: 59 | Admitting: Obstetrics & Gynecology

## 2015-06-26 VITALS — BP 130/80 | HR 96 | Ht 64.0 in | Wt 212.0 lb

## 2015-06-26 DIAGNOSIS — Z1151 Encounter for screening for human papillomavirus (HPV): Secondary | ICD-10-CM | POA: Diagnosis present

## 2015-06-26 DIAGNOSIS — Z01419 Encounter for gynecological examination (general) (routine) without abnormal findings: Secondary | ICD-10-CM | POA: Diagnosis not present

## 2015-06-26 NOTE — Progress Notes (Signed)
Patient ID: Breanna Lucero, female   DOB: Oct 01, 1975, 39 y.o.   MRN: [id number]   Subjective:     Breanna Lucero is a 39 y.o. female here for a routine exam.  No LMP recorded. Patient has had an implant. [id number] Birth Control Method:  nexplanon Menstrual Calendar(currently): irregular  Current complaints: none.   Current acute medical issues:  none   Recent Gynecologic History No LMP recorded. Patient has had an implant. Last Pap: 2015,  normal Last mammogram: ,    Past Medical History  Diagnosis Date  . Hypothyroid   . Goiter   . ASCUS (atypical squamous cells of undetermined significance) on Pap smear     neg high risk hpv  . Heart murmur     as a child  . Anemia     menorrhagia induced  . Hypothyroid     Past Surgical History  Procedure Laterality Date  . Thyroid surgery    . Colposcopy    . Cesarean section      x 2    OB History    Gravida Para Term Preterm AB TAB SAB Ectopic Multiple Living   4 3 1  1  1          Social History   Social History  . Marital Status: Married    Spouse Name: N/A  . Number of Children: N/A  . Years of Education: N/A   Social History Main Topics  . Smoking status: Never Smoker   . Smokeless tobacco: Never Used  . Alcohol Use: No  . Drug Use: No  . Sexual Activity: Yes    Birth Control/ Protection: Implant   Other Topics Concern  . None   Social History Narrative    Family History  Problem Relation Age of Onset  . Heart disease Father      Current outpatient prescriptions:  .  ibuprofen (ADVIL,MOTRIN) 400 MG tablet, Take 400 mg by mouth every 6 (six) hours as needed. As needed, Disp: , Rfl:  .  Levothyroxine Sodium (SYNTHROID PO), Take 200 mg by mouth daily. , Disp: , Rfl:  .  megestrol (MEGACE) 40 MG tablet, Take 3/day (at the same time) for 5 days; 2/day for 5 days, then 1/day PO prn bleeding (Patient not taking: Reported on 06/26/2015), Disp: 60 tablet, Rfl: 3  Review of Systems  Review of Systems   Constitutional: Negative for fever, chills, weight loss, malaise/fatigue and diaphoresis.  HENT: Negative for hearing loss, ear pain, nosebleeds, congestion, sore throat, neck pain, tinnitus and ear discharge.   Eyes: Negative for blurred vision, double vision, photophobia, pain, discharge and redness.  Respiratory: Negative for cough, hemoptysis, sputum production, shortness of breath, wheezing and stridor.   Cardiovascular: Negative for chest pain, palpitations, orthopnea, claudication, leg swelling and PND.  Gastrointestinal: negative for abdominal pain. Negative for heartburn, nausea, vomiting, diarrhea, constipation, blood in stool and melena.  Genitourinary: Negative for dysuria, urgency, frequency, hematuria and flank pain.  Musculoskeletal: Negative for myalgias, back pain, joint pain and falls.  Skin: Negative for itching and rash.  Neurological: Negative for dizziness, tingling, tremors, sensory change, speech change, focal weakness, seizures, loss of consciousness, weakness and headaches.  Endo/Heme/Allergies: Negative for environmental allergies and polydipsia. Does not bruise/bleed easily.  Psychiatric/Behavioral: Negative for depression, suicidal ideas, hallucinations, memory loss and substance abuse. The patient is not nervous/anxious and does not have insomnia.        Objective:  Blood pressure 130/80, pulse 96, height 5\' 4"  (1.626  m), weight 212 lb (96.163 kg).   Physical Exam  Vitals reviewed. Constitutional: She is oriented to person, place, and time. She appears well-developed and well-nourished.  HENT:  Head: Normocephalic and atraumatic.        Right Ear: External ear normal.  Left Ear: External ear normal.  Nose: Nose normal.  Mouth/Throat: Oropharynx is clear and moist.  Eyes: Conjunctivae and EOM are normal. Pupils are equal, round, and reactive to light. Right eye exhibits no discharge. Left eye exhibits no discharge. No scleral icterus.  Neck: Normal range of  motion. Neck supple. No tracheal deviation present. No thyromegaly present.  Cardiovascular: Normal rate, regular rhythm, normal heart sounds and intact distal pulses.  Exam reveals no gallop and no friction rub.   No murmur heard. Respiratory: Effort normal and breath sounds normal. No respiratory distress. She has no wheezes. She has no rales. She exhibits no tenderness.  GI: Soft. Bowel sounds are normal. She exhibits no distension and no mass. There is no tenderness. There is no rebound and no guarding.  Genitourinary:  Breasts no masses skin changes or nipple changes bilaterally      Vulva is normal without lesions Vagina is pink moist without discharge Cervix normal in appearance and pap is done Uterus is normal size shape and contour Adnexa is negative with normal sized ovaries   Musculoskeletal: Normal range of motion. She exhibits no edema and no tenderness.  Neurological: She is alert and oriented to person, place, and time. She has normal reflexes. She displays normal reflexes. No cranial nerve deficit. She exhibits normal muscle tone. Coordination normal.  Skin: Skin is warm and dry. No rash noted. No erythema. No pallor.  Psychiatric: She has a normal mood and affect. Her behavior is normal. Judgment and thought content normal.       Assessment:    Healthy female exam.    Plan:    Contraception: Nexplanon. Follow up in: 1 year.     Remove nexplanon next month

## 2015-07-02 LAB — CYTOLOGY - PAP

## 2015-07-31 ENCOUNTER — Encounter: Payer: 59 | Admitting: Obstetrics & Gynecology

## 2015-08-01 ENCOUNTER — Encounter: Payer: 59 | Admitting: Obstetrics & Gynecology

## 2015-08-07 ENCOUNTER — Ambulatory Visit (INDEPENDENT_AMBULATORY_CARE_PROVIDER_SITE_OTHER): Payer: 59 | Admitting: Obstetrics & Gynecology

## 2015-08-07 ENCOUNTER — Encounter: Payer: Self-pay | Admitting: Obstetrics & Gynecology

## 2015-08-07 VITALS — BP 140/100 | HR 78 | Wt 216.0 lb

## 2015-08-07 DIAGNOSIS — Z3046 Encounter for surveillance of implantable subdermal contraceptive: Secondary | ICD-10-CM | POA: Diagnosis not present

## 2015-08-07 NOTE — Progress Notes (Signed)
Patient ID: Breanna Lucero, female   DOB: [birth year], 40 y.o.   MRN: [id number] Nexplanon removal  Left arm examined and evlauted nexplanon isolated  Marked  1% lidocaine injected  Dissected bluntly after stab incision  Removed with 1 attempt  Dressed  Follow up prn  Florian Buff, MD 08/07/2015 3:11 PM

## 2018-09-23 ENCOUNTER — Encounter: Payer: Self-pay | Admitting: Family Medicine

## 2018-09-23 ENCOUNTER — Other Ambulatory Visit (HOSPITAL_COMMUNITY)
Admission: RE | Admit: 2018-09-23 | Discharge: 2018-09-23 | Disposition: A | Discharge status: Home / Self Care | Payer: 59 | Source: Ambulatory Visit | Attending: Family Medicine | Admitting: Family Medicine

## 2018-09-23 ENCOUNTER — Ambulatory Visit (INDEPENDENT_AMBULATORY_CARE_PROVIDER_SITE_OTHER): Payer: 59 | Admitting: Family Medicine

## 2018-09-23 VITALS — BP 162/87 | HR 71 | Ht 63.0 in | Wt 216.0 lb

## 2018-09-23 DIAGNOSIS — Z01419 Encounter for gynecological examination (general) (routine) without abnormal findings: Secondary | ICD-10-CM

## 2018-09-23 NOTE — Progress Notes (Signed)
    GYNECOLOGY ANNUAL PREVENTATIVE CARE ENCOUNTER NOTE  Subjective:   Breanna Lucero is a 43 y.o. [id number] female here for a routine annual gynecologic exam.  Current complaints: none.  Still having regular periods and not using any protection with partner.  Denies abnormal vaginal bleeding, discharge, pelvic pain, problems with intercourse or other gynecologic concerns.    Gynecologic History Patient's last menstrual period was 08/09/2018. Contraception: none Last Pap: 2016. Results were: normal   Health Maintenance Due  Topic Date Due  . HIV Screening  12/08/1991  . TETANUS/TDAP  12/08/1995  . INFLUENZA VACCINE  03/04/2018  . PAP SMEAR-Modifier  06/25/2018    The following portions of the patient's history were reviewed and updated as appropriate: allergies, current medications, past family history, past medical history, past social history, past surgical history and problem list.  Review of Systems Pertinent items are noted in HPI.   Objective:  BP (!) 162/87 (BP Location: Right Arm, Patient Position: Sitting, Cuff Size: Large)   Pulse 71   Ht 5\' 3"  (1.6 m)   Wt 216 lb (98 kg)   LMP 08/09/2018   BMI 38.26 kg/m  CONSTITUTIONAL: Well-developed, well-nourished female in no acute distress.  HENT:  Normocephalic, atraumatic, External right and left ear normal. Oropharynx is clear and moist EYES:  No scleral icterus.  NECK: Normal range of motion, supple, no masses.  Normal thyroid.  SKIN: Skin is warm and dry. No rash noted. Not diaphoretic. No erythema. No pallor. NEUROLOGIC: Alert and oriented to person, place, and time. Normal reflexes, muscle tone coordination. No cranial nerve deficit noted. PSYCHIATRIC: Normal mood and affect. Normal behavior. Normal judgment and thought content. CARDIOVASCULAR: Normal heart rate noted, regular rhythm. 2+ distal pulses. RESPIRATORY: Effort and breath sounds normal, no problems with respiration noted. BREASTS: Symmetric in size. No  masses, skin changes, nipple drainage, or lymphadenopathy. ABDOMEN: Soft,  no distention noted.  No tenderness, rebound or guarding.  PELVIC: Normal appearing external genitalia; normal appearing vaginal mucosa and cervix.  No abnormal discharge noted.  Pap smear obtained.  Normal uterine size, no other palpable masses, no uterine or adnexal tenderness. MUSCULOSKELETAL: Normal range of motion.      Assessment and Plan:  1) Annual gynecologic examination with pap smear:  Will follow up results of pap smear and manage accordingly. Routine preventative health maintenance measures emphasized.  2) Contraception counseling: Patient is at risk for unintended pregnancy.  Patient desires no BCM at this time. Recommended starting PNV given risk of pregnancy. She will follow up in  1 for surveillance.  She was told to call with any further questions, or with any concerns about this method of contraception.  Emphasized use of condoms 100% of the time for STI prevention and pregnancy prevention since she is still having regular periods.    Please refer to After Visit Summary for other counseling recommendations.   No follow-ups on file.  Caren Macadam, MD, MPH, ABFM Attending [place] for [facility]

## 2018-09-24 LAB — CYTOLOGY - PAP
ADEQUACY: ABSENT
Diagnosis: NEGATIVE
HPV: NOT DETECTED

## 2018-11-24 ENCOUNTER — Telehealth: Payer: Self-pay | Admitting: Obstetrics & Gynecology

## 2018-11-24 NOTE — Telephone Encounter (Signed)
Pt having very heavy bleeding and is concerned please call pt and advised

## 2018-11-24 NOTE — Telephone Encounter (Signed)
Spoke with pt. Pt had a regular period in April. Pt has always had heavy periods. 1-2 weeks after period, pt started bleeding again. Pt passed clots. Pt is still bleeding. Clots not as big now. Not on any birth control now. Spoke with JAG and advised webex appt would be fine. Pt voiced understanding. Call transferred to front desk for appt. [place]

## 2018-11-25 ENCOUNTER — Encounter: Payer: Self-pay | Admitting: Obstetrics & Gynecology

## 2018-11-25 ENCOUNTER — Ambulatory Visit (INDEPENDENT_AMBULATORY_CARE_PROVIDER_SITE_OTHER): Payer: 59 | Admitting: Obstetrics & Gynecology

## 2018-11-25 ENCOUNTER — Other Ambulatory Visit: Payer: Self-pay

## 2018-11-25 VITALS — Ht 63.0 in | Wt 213.0 lb

## 2018-11-25 DIAGNOSIS — N921 Excessive and frequent menstruation with irregular cycle: Secondary | ICD-10-CM | POA: Diagnosis not present

## 2018-11-25 DIAGNOSIS — N946 Dysmenorrhea, unspecified: Secondary | ICD-10-CM | POA: Diagnosis not present

## 2018-11-25 MED ORDER — MEGESTROL ACETATE 40 MG PO TABS: ORAL_TABLET | ORAL | 3 refills | Status: DC

## 2018-11-25 NOTE — Progress Notes (Signed)
TELEHEALTH VIRTUAL GYNECOLOGY VISIT ENCOUNTER NOTE  I connected with Breanna Lucero on 11/25/18 at 10:45 AM EDT by telephone at home and verified that I am speaking with the correct person using two identifiers.   I discussed the limitations, risks, security and privacy concerns of performing an evaluation and management service by telephone and the availability of in person appointments. I also discussed with the patient that there may be a patient responsible charge related to this service. The patient expressed understanding and agreed to proceed.   History:  Breanna Lucero is a 43 y.o. G21P1010 female being evaluated today for menometrorrhagia. She denies any abnormal vaginal discharge, bleeding, pelvic pain or other concerns.     She has been having regular periods but normal heavy for her Had a normal heavy the beginning of the month, started bleeding 11/17/2018 and heavy cramping and heavy bleeding, extremely     Past Medical History:  Diagnosis Date  . Anemia    menorrhagia induced  . ASCUS (atypical squamous cells of undetermined significance) on Pap smear    neg high risk hpv  . Goiter   . Heart murmur    as a child  . Hypothyroid   . Hypothyroid    Past Surgical History:  Procedure Laterality Date  . CESAREAN SECTION     x 2  . COLPOSCOPY    . THYROID SURGERY     The following portions of the patient's history were reviewed and updated as appropriate: allergies, current medications, past family history, past medical history, past social history, past surgical history and problem list.   Health Maintenance:  Normal pap and negative HRHPV on .  Normal mammogram on n/a.   Review of Systems:  Pertinent items noted in HPI and remainder of comprehensive ROS otherwise negative.  Physical Exam:  Physical exam deferred due to nature of the encounter  Labs and Imaging No results found for this or any previous visit (from the past 336 hour(s)). No results found.      Meds ordered this encounter  Medications  . megestrol (MEGACE) 40 MG tablet    Sig: 3 tablets a day for 5 days, 2 tablets a day for 5 days then 1 tablet daily    Dispense:  45 tablet    Refill:  3    Orders Placed This Encounter  Procedures  . US PELVIS (TRANSABDOMINAL ONLY)  . US PELVIS TRANSVANGINAL NON-OB (TV ONLY)    Assessment and Plan:     Menometrorrhagia - Plan: US PELVIS (TRANSABDOMINAL ONLY), US PELVIS TRANSVANGINAL NON-OB (TV ONLY)  Dysmenorrhea - Plan: US PELVIS (TRANSABDOMINAL ONLY), US PELVIS TRANSVANGINAL NON-OB (TV ONLY)         Meds ordered this encounter  Medications  . megestrol (MEGACE) 40 MG tablet    Sig: 3 tablets a day for 5 days, 2 tablets a day for 5 days then 1 tablet daily    Dispense:  45 tablet    Refill:  3   Return in about 6 weeks (around 01/06/2019) for GYN sono, office visit with Dr Elonda Husky.  I discussed the assessment and treatment plan with the patient. The patient was provided an opportunity to ask questions and all were answered. The patient agreed with the plan and demonstrated an understanding of the instructions.   The patient was advised to call back or seek an in-person evaluation/go to the ED if the symptoms worsen or if the condition fails to improve as anticipated.  I  provided 15 minutes of non-face-to-face time during this encounter.   Florian Buff, [place] for [facility]

## 2019-01-06 ENCOUNTER — Telehealth: Payer: Self-pay | Admitting: *Deleted

## 2019-01-06 ENCOUNTER — Other Ambulatory Visit: Payer: 59

## 2019-01-06 NOTE — Telephone Encounter (Signed)
Patient informed we are still not allowing any visitors or children to come in during appointment time unless physical assistance is needed. Asked if has had any exposure to anyone suspected or confirmed of having COVID-19 or if she was experiencing any of the following, to reschedule: fever, cough, shortness of breath, muscle pain, diarrhea, rash, vomiting, abdominal pain, red eye, weakness, bruising, bleeding, joint pain, or a severe headache.  Stated no to all. Asked to call our office on arrival to our parking lot to complete registration over the phone. Advised to also use the provided hand sanitizer when entering the office and to wear a mask if she has one, if not, we will provide one. Pt verbalized understanding.

## 2019-01-06 NOTE — Telephone Encounter (Signed)
Erroneous encounter

## 2019-01-07 ENCOUNTER — Ambulatory Visit (INDEPENDENT_AMBULATORY_CARE_PROVIDER_SITE_OTHER): Payer: 59

## 2019-01-07 ENCOUNTER — Ambulatory Visit (INDEPENDENT_AMBULATORY_CARE_PROVIDER_SITE_OTHER): Payer: 59 | Admitting: Obstetrics & Gynecology

## 2019-01-07 ENCOUNTER — Encounter: Payer: Self-pay | Admitting: Obstetrics & Gynecology

## 2019-01-07 ENCOUNTER — Other Ambulatory Visit: Payer: Self-pay

## 2019-01-07 VITALS — BP 147/90 | HR 86 | Ht 64.0 in | Wt 214.0 lb

## 2019-01-07 DIAGNOSIS — N921 Excessive and frequent menstruation with irregular cycle: Secondary | ICD-10-CM

## 2019-01-07 DIAGNOSIS — N946 Dysmenorrhea, unspecified: Secondary | ICD-10-CM | POA: Diagnosis not present

## 2019-01-07 NOTE — Progress Notes (Signed)
PELVIC US TA/TV: heterogeneous anteverted uterus with mult fibroids,(1)posterior mid submucosal fibroid 1.9 x 1.7 x 2 cm,(#2)anterior subserosal fibroid 2.1 x 2.1 x 2.1 cm,(#3) anterior/mid intramural 1.4 x 1.5 x 1.8 cm,EEC 8.8 mm,endometrium appears to be distorted by posterior fibroid,normal ovaries bilat,no free fluid,ovaries appear mobile,no pain during ultrasound

## 2019-01-07 NOTE — Progress Notes (Signed)
Follow up appointment for results  Chief Complaint  Patient presents with  . Follow-up    [place] today    Blood pressure (!) 147/90, pulse 86, height 5\' 4"  (1.626 m), weight 214 lb (97.1 kg).  US Pelvis Transvanginal Non-ob (tv Only)  Result Date: 01/07/2019 GYNECOLOGIC SONOGRAM Breanna Lucero is a 43 y.o. G4P1010 LMP 12/24/2018,she is here for a pelvic sonogram for menorrhagia/dysmenorrhea.. Uterus                      9.54 x 5.2 x 6.8 cm, Total uterine volume 176 cc, heterogeneous anteverted uterus with mult fibroids Endometrium          8.8 mm, symmetrical, endometrium appears to be distorted by posterior fibroid Right ovary             3.7 x 2.5 x 3.2 cm, wnl Left ovary                2.1 x 3.8 x 2.2 cm, wnl No free fluid Fibroids: (1)posterior mid submucosal fibroid 1.9 x 1.7 x 2 cm,(#2)anterior subserosal fibroid 2.1 x 2.1 x 2.1 cm,(#3) anterior/mid intramural 1.4 x 1.5 x 1.8 cm Technician Comments: PELVIC US TA/TV: heterogeneous anteverted uterus with mult fibroids,(1)posterior mid submucosal fibroid 1.9 x 1.7 x 2 cm,(#2)anterior subserosal fibroid 2.1 x 2.1 x 2.1 cm,(#3) anterior/mid intramural 1.4 x 1.5 x 1.8 cm,EEC 8.8 mm,endometrium appears to be distorted by posterior fibroid,normal ovaries bilat,no free fluid,ovaries appear mobile,no pain during ultrasound [employer] 01/07/2019 11:23 AM Clinical Impression and recommendations: I have reviewed the sonogram results above, combined with the patient's current clinical course, below are my impressions and any appropriate recommendations for management based on the sonographic findings. Small fibroids, one is submucosal, which may be contributing to the patient's bleeding problems Endometrium is normal Both ovaries are normal Florian Buff 01/07/2019 11:37 AM   US Pelvis (transabdominal Only)  Result Date: 01/07/2019 GYNECOLOGIC SONOGRAM Breanna Lucero is a 42 y.o. G4P1010 LMP 12/24/2018,she is here for a pelvic sonogram for menorrhagia/dysmenorrhea..  Uterus                      9.54 x 5.2 x 6.8 cm, Total uterine volume 176 cc, heterogeneous anteverted uterus with mult fibroids Endometrium          8.8 mm, symmetrical, endometrium appears to be distorted by posterior fibroid Right ovary             3.7 x 2.5 x 3.2 cm, wnl Left ovary                2.1 x 3.8 x 2.2 cm, wnl No free fluid Fibroids: (1)posterior mid submucosal fibroid 1.9 x 1.7 x 2 cm,(#2)anterior subserosal fibroid 2.1 x 2.1 x 2.1 cm,(#3) anterior/mid intramural 1.4 x 1.5 x 1.8 cm Technician Comments: PELVIC US TA/TV: heterogeneous anteverted uterus with mult fibroids,(1)posterior mid submucosal fibroid 1.9 x 1.7 x 2 cm,(#2)anterior subserosal fibroid 2.1 x 2.1 x 2.1 cm,(#3) anterior/mid intramural 1.4 x 1.5 x 1.8 cm,EEC 8.8 mm,endometrium appears to be distorted by posterior fibroid,normal ovaries bilat,no free fluid,ovaries appear mobile,no pain during ultrasound [employer] 01/07/2019 11:23 AM Clinical Impression and recommendations: I have reviewed the sonogram results above, combined with the patient's current clinical course, below are my impressions and any appropriate recommendations for management based on the sonographic findings. Small fibroids, one is submucosal, which may be contributing to the patient's bleeding problems Endometrium is  normal Both ovaries are normal Florian Buff 01/07/2019 11:37 AM      MEDS ordered this encounter: No orders of the defined types were placed in this encounter.   Orders for this encounter: No orders of the defined types were placed in this encounter.   Impression: Menometrorrhagia  Dysmenorrhea    Plan: >pt will continue on megestrol for now >option for ablation is given +/- IUD >pt will weigh her options  Follow Up: Return if symptoms worsen or fail to improve.      All questions were answered.  Past Medical History:  Diagnosis Date  . Anemia    menorrhagia induced  . ASCUS (atypical squamous cells of undetermined  significance) on Pap smear    neg high risk hpv  . Goiter   . Heart murmur    as a child  . Hypothyroid   . Hypothyroid     Past Surgical History:  Procedure Laterality Date  . CESAREAN SECTION     x 2  . COLPOSCOPY    . THYROID SURGERY      OB History    Gravida  4   Para  3   Term  1   Preterm      AB  1   Living        SAB  1   TAB      Ectopic      Multiple      Live Births              No Known Allergies  Social History   Socioeconomic History  . Marital status: Married    Spouse name: Not on file  . Number of children: Not on file  . Years of education: Not on file  . Highest education level: Not on file  Occupational History  . Not on file  Social Needs  . Financial resource strain: Not on file  . Food insecurity:    Worry: Not on file    Inability: Not on file  . Transportation needs:    Medical: Not on file    Non-medical: Not on file  Tobacco Use  . Smoking status: Never Smoker  . Smokeless tobacco: Never Used  Substance and Sexual Activity  . Alcohol use: No    Alcohol/week: 0.0 standard drinks  . Drug use: No  . Sexual activity: Yes    Birth control/protection: None  Lifestyle  . Physical activity:    Days per week: Not on file    Minutes per session: Not on file  . Stress: Not on file  Relationships  . Social connections:    Talks on phone: Not on file    Gets together: Not on file    Attends religious service: Not on file    Active member of club or organization: Not on file    Attends meetings of clubs or organizations: Not on file    Relationship status: Not on file  Other Topics Concern  . Not on file  Social History Narrative  . Not on file    Family History  Problem Relation Age of Onset  . Heart disease Father

## 2020-08-26 ENCOUNTER — Other Ambulatory Visit: Payer: Self-pay

## 2020-08-26 ENCOUNTER — Encounter: Payer: Self-pay | Admitting: Emergency Medicine

## 2020-08-26 ENCOUNTER — Ambulatory Visit
Admission: EM | Admit: 2020-08-26 | Discharge: 2020-08-26 | Disposition: A | Discharge status: Home / Self Care | Payer: 59

## 2020-08-26 DIAGNOSIS — J069 Acute upper respiratory infection, unspecified: Secondary | ICD-10-CM | POA: Diagnosis not present

## 2020-08-26 DIAGNOSIS — Z20822 Contact with and (suspected) exposure to covid-19: Secondary | ICD-10-CM

## 2020-08-26 HISTORY — DX: Essential (primary) hypertension: I10

## 2020-08-26 MED ORDER — AZITHROMYCIN 250 MG PO TABS: 250.0000 mg | ORAL_TABLET | Freq: Every day | ORAL | 0 refills | Status: DC

## 2020-08-26 NOTE — Discharge Instructions (Addendum)
If viral testing is negative, I have sent in azithromycin for you to take. Take 2 tablets today, then one tablet daily for the next 4 days.  Your COVID test is pending. You should self quarantine until the test result is back.    Take Tylenol or ibuprofen as needed for fever or discomfort. Rest and keep yourself hydrated.    Follow-up with your primary care provider if your symptoms are not improving.

## 2020-08-26 NOTE — ED Triage Notes (Signed)
Coughing x 4 weeks, congestion and headache, productive cough

## 2020-08-28 LAB — COVID-19, FLU A+B NAA
Influenza A, NAA: NOT DETECTED
Influenza B, NAA: NOT DETECTED
SARS-CoV-2, NAA: NOT DETECTED

## 2020-08-29 NOTE — ED Provider Notes (Signed)
[facility]   [id number] 08/26/20 Arrival Time: 84   CC: COVID symptoms  SUBJECTIVE: History from: patient.  Breanna Lucero is a 45 y.o. female who presents with coughing for the last 4 weeks, congestion and headache. Denies sick exposure to COVID, flu or strep. Denies recent travel. Has negative history of Covid. Has not completed Covid vaccines. Has taken OTC cough and cold for this. There are no aggravating or alleviating factors. Denies previous symptoms in the past. Denies fever, sinus pain, rhinorrhea, SOB, wheezing, chest pain, nausea, changes in bowel or bladder habits.    ROS: As per HPI.  All other pertinent ROS negative.     Past Medical History:  Diagnosis Date  . Anemia    menorrhagia induced  . ASCUS (atypical squamous cells of undetermined significance) on Pap smear    neg high risk hpv  . Goiter   . Heart murmur    as a child  . Hypertension   . Hypothyroid   . Hypothyroid    Past Surgical History:  Procedure Laterality Date  . CESAREAN SECTION     x 2  . COLPOSCOPY    . THYROID SURGERY     No Known Allergies No current facility-administered medications on file prior to encounter.   Current Outpatient Medications on File Prior to Encounter  Medication Sig Dispense Refill  . iron polysaccharides (NIFEREX) 150 MG capsule Take 150 mg by mouth daily.    . valsartan-hydrochlorothiazide (DIOVAN-HCT) 320-12.5 MG tablet Take 1 tablet by mouth daily.    . Levothyroxine Sodium (SYNTHROID PO) Take 125 mg by mouth daily.     . megestrol (MEGACE) 40 MG tablet 3 tablets a day for 5 days, 2 tablets a day for 5 days then 1 tablet daily 45 tablet 3   Social History   Socioeconomic History  . Marital status: Married    Spouse name: Not on file  . Number of children: Not on file  . Years of education: Not on file  . Highest education level: Not on file  Occupational History  . Not on file  Tobacco Use  . Smoking status: Never Smoker  . Smokeless  tobacco: Never Used  Vaping Use  . Vaping Use: Never used  Substance and Sexual Activity  . Alcohol use: No    Alcohol/week: 0.0 standard drinks  . Drug use: No  . Sexual activity: Yes    Birth control/protection: None  Other Topics Concern  . Not on file  Social History Narrative  . Not on file   Social Determinants of Health   Financial Resource Strain: Not on file  Food Insecurity: Not on file  Transportation Needs: Not on file  Physical Activity: Not on file  Stress: Not on file  Social Connections: Not on file  Intimate Partner Violence: Not on file   Family History  Problem Relation Age of Onset  . Heart disease Father     OBJECTIVE:  Vitals:   08/26/20 1555  BP: (!) 154/87  Pulse: 85  Resp: 18  Temp: 98.4 F (36.9 C)  TempSrc: Oral  SpO2: 98%  Weight: 216 lb (98 kg)  Height: 5\' 3"  (1.6 m)     General appearance: alert; appears fatigued, but nontoxic; speaking in full sentences and tolerating own secretions HEENT: NCAT; Ears: EACs clear, TMs pearly gray; Eyes: PERRL.  EOM grossly intact. Sinuses: nontender; Nose: nares patent with clear rhinorrhea, Throat: oropharynx erythematous, cobblestoning present, tonsils non erythematous or enlarged, uvula midline  Neck: supple without LAD Lungs: unlabored respirations, symmetrical air entry; cough: absent; no respiratory distress; CTAB Heart: regular rate and rhythm.  Radial pulses 2+ symmetrical bilaterally Skin: warm and dry Psychological: alert and cooperative; normal mood and affect  LABS:  No results found for this or any previous visit (from the past 24 hour(s)).   ASSESSMENT & PLAN:  1. Viral URI with cough   2. Exposure to COVID-19 virus     Meds ordered this encounter  Medications  . azithromycin (ZITHROMAX) 250 MG tablet    Sig: Take 1 tablet (250 mg total) by mouth daily. Take first 2 tablets together, then 1 every day until finished.    Dispense:  6 tablet    Refill:  0    Order Specific  Question:   Supervising Provider    Answer:   Chase Picket [id number]    Prescribed azithromycin Get this filled if viral testing is negative to treat the URI Continue supportive care at home COVID and flu testing ordered.  It will take between 2-3 days for test results. Someone will contact you regarding abnormal results.   Work note provided Patient should remain in quarantine until they have received Covid results.  If negative you may resume normal activities (go back to work/school) while practicing hand hygiene, social distance, and mask wearing.  If positive, patient should remain in quarantine for at least 5 days from symptom onset AND greater than 72 hours after symptoms resolution (absence of fever without the use of fever-reducing medication and improvement in respiratory symptoms), whichever is longer Get plenty of rest and push fluids Use OTC zyrtec for nasal congestion, runny nose, and/or sore throat Use OTC flonase for nasal congestion and runny nose Use medications daily for symptom relief Use OTC medications like ibuprofen or tylenol as needed fever or pain Call or go to the ED if you have any new or worsening symptoms such as fever, worsening cough, shortness of breath, chest tightness, chest pain, turning blue, changes in mental status.  Reviewed expectations re: course of current medical issues. Questions answered. Outlined signs and symptoms indicating need for more acute intervention. Patient verbalized understanding. After Visit Summary given.         Faustino Congress, NP 08/29/20 1935

## 2020-09-05 ENCOUNTER — Other Ambulatory Visit: Payer: Self-pay

## 2020-09-07 ENCOUNTER — Other Ambulatory Visit: Payer: Self-pay

## 2020-09-07 ENCOUNTER — Ambulatory Visit (INDEPENDENT_AMBULATORY_CARE_PROVIDER_SITE_OTHER): Payer: 59 | Admitting: Endocrinology

## 2020-09-07 ENCOUNTER — Encounter: Payer: Self-pay | Admitting: Endocrinology

## 2020-09-07 DIAGNOSIS — C73 Malignant neoplasm of thyroid gland: Secondary | ICD-10-CM | POA: Diagnosis not present

## 2020-09-07 MED ORDER — LEVOTHYROXINE SODIUM 125 MCG PO TABS: 125.0000 ug | ORAL_TABLET | Freq: Every day | ORAL | 3 refills | Status: AC

## 2020-09-07 NOTE — Progress Notes (Signed)
Subjective:    Patient ID: Breanna Lucero, female    DOB: [birth year], 45 y.o.   MRN: [id number]  HPI Pt is referred by Dr Alyson Ingles, for hypothyroidism.  Pt reports hypothyroidism was dx'ed in 2003.  she had thyroidect in 2004.  She has been on prescribed thyroid hormone therapy since then.  she has never taken kelp or any other type of non-prescribed thyroid product.  She is not considering a pregnancy.  she has never had XRT to the neck.   She does not notice any swelling, nodule, or pain at the ant neck.  pt states she feels well in general, except for weight gain and dry skin.   Past Medical History:  Diagnosis Date  . Anemia    menorrhagia induced  . ASCUS (atypical squamous cells of undetermined significance) on Pap smear    neg high risk hpv  . Goiter   . Heart murmur    as a child  . Hypertension   . Hypothyroid   . Hypothyroid     Past Surgical History:  Procedure Laterality Date  . CESAREAN SECTION     x 2  . COLPOSCOPY    . THYROID SURGERY      Social History   Socioeconomic History  . Marital status: Married    Spouse name: Not on file  . Number of children: Not on file  . Years of education: Not on file  . Highest education level: Not on file  Occupational History  . Not on file  Tobacco Use  . Smoking status: Never Smoker  . Smokeless tobacco: Never Used  Vaping Use  . Vaping Use: Never used  Substance and Sexual Activity  . Alcohol use: No    Alcohol/week: 0.0 standard drinks  . Drug use: No  . Sexual activity: Yes    Birth control/protection: None  Other Topics Concern  . Not on file  Social History Narrative  . Not on file   Social Determinants of Health   Financial Resource Strain: Not on file  Food Insecurity: Not on file  Transportation Needs: Not on file  Physical Activity: Not on file  Stress: Not on file  Social Connections: Not on file  Intimate Partner Violence: Not on file    Current Outpatient Medications on File Prior to Visit   Medication Sig Dispense Refill  . iron polysaccharides (NIFEREX) 150 MG capsule Take 150 mg by mouth daily.    . valsartan-hydrochlorothiazide (DIOVAN-HCT) 320-12.5 MG tablet Take 1 tablet by mouth daily.     No current facility-administered medications on file prior to visit.    No Known Allergies  Family History  Problem Relation Age of Onset  . Heart disease Father   . Thyroid disease Father     BP (!) 150/88 (BP Location: Right Arm, Patient Position: Sitting, Cuff Size: Large)   Pulse 79   Ht 5' 4.5" (1.638 m)   Wt 218 lb 12.8 oz (99.2 kg)   SpO2 97%   BMI 36.98 kg/m   Review of Systems denies depression, constipation, numbness, and cold intolerance.      Objective:   Physical Exam VS: see vs page GEN: no distress Neck: a healed scar is present.  I do not appreciate a nodule in the thyroid or elsewhere in the neck  outside test results are reviewed:  TSH=0.59  I have reviewed outside records, and summarized: Pt was noted to have h/o thyroid cancer, and referred here.  She had multifocal  PTC, and had adjuvant RAI      Assessment & Plan:  HTN: is noted today PTC: low risk of recurrence, but she is due for recehck Hypothyroidism: well-replaced.  Please continue the same synthroid.   Patient Instructions  Your blood pressure is high today.  Please see your primary care provider soon, to have it rechecked.   Blood tests are requested for you today.  We'll let you know about the results.   Please come back for a follow-up appointment in 1 year.

## 2020-09-07 NOTE — Patient Instructions (Addendum)
Your blood pressure is high today.  Please see your primary care provider soon, to have it rechecked.   Blood tests are requested for you today.  We'll let you know about the results.   Please come back for a follow-up appointment in 1 year.   

## 2020-09-10 LAB — THYROGLOBULIN LEVEL: Thyroglobulin: <0.1 ng/mL — ABNORMAL LOW

## 2020-09-10 LAB — THYROGLOBULIN ANTIBODY: Thyroglobulin Ab: <1 IU/mL (ref <=1)

## 2020-10-09 ENCOUNTER — Ambulatory Visit: Payer: 59 | Admitting: Endocrinology

## 2021-09-13 ENCOUNTER — Ambulatory Visit: Payer: 59 | Admitting: Endocrinology

## 2022-09-12 ENCOUNTER — Other Ambulatory Visit: Payer: Self-pay | Admitting: Obstetrics and Gynecology

## 2022-09-17 ENCOUNTER — Other Ambulatory Visit: Payer: Self-pay | Admitting: Obstetrics and Gynecology

## 2022-09-17 ENCOUNTER — Other Ambulatory Visit: Payer: Self-pay

## 2022-09-17 DIAGNOSIS — D509 Iron deficiency anemia, unspecified: Secondary | ICD-10-CM | POA: Insufficient documentation

## 2022-09-17 DIAGNOSIS — D508 Other iron deficiency anemias: Secondary | ICD-10-CM

## 2022-09-17 MED ORDER — SODIUM CHLORIDE 0.9 % IV SOLN: 510.0000 mg | INTRAVENOUS | Status: AC

## 2022-09-23 ENCOUNTER — Telehealth: Payer: Self-pay | Admitting: Pharmacy Technician

## 2022-09-23 NOTE — Telephone Encounter (Addendum)
Auth Submission no auth needed - venofer Feraheme is non preferred and will be denied. Venofer is preferred med. Cassie @ [facility]  [place] Phone: [phone] Awaiting new referral for venofer. Payer: [employer] Medication & CPT/J Code(s) submitted: Venofer (Iron Sucrose) J1756 Route of submission (phone, fax, portal):  Phone # Fax # Auth type: Buy/Bill Units/visits requested: 1 Reference number:  Approval from: 09/29/22-02/27/23   [facility] insurance Id: [id number] [place]: [id number] [place]: [id number] [place]: [phone]

## 2022-09-29 ENCOUNTER — Other Ambulatory Visit (HOSPITAL_COMMUNITY): Payer: Self-pay | Admitting: Obstetrics and Gynecology

## 2022-09-29 ENCOUNTER — Other Ambulatory Visit: Payer: Self-pay | Admitting: Pharmacy Technician

## 2022-09-29 DIAGNOSIS — N920 Excessive and frequent menstruation with regular cycle: Secondary | ICD-10-CM

## 2022-09-30 ENCOUNTER — Encounter (HOSPITAL_COMMUNITY): Payer: Self-pay | Admitting: Obstetrics and Gynecology

## 2022-09-30 ENCOUNTER — Other Ambulatory Visit: Payer: Self-pay

## 2022-10-01 ENCOUNTER — Encounter (HOSPITAL_COMMUNITY)
Admission: RE | Admit: 2022-10-01 | Discharge: 2022-10-01 | Disposition: A | Discharge status: Home / Self Care | Payer: 59 | Source: Ambulatory Visit | Attending: Obstetrics and Gynecology | Admitting: Obstetrics and Gynecology

## 2022-10-01 VITALS — BP 123/84 | HR 71 | Temp 98.0°F | Resp 18

## 2022-10-01 DIAGNOSIS — D509 Iron deficiency anemia, unspecified: Secondary | ICD-10-CM | POA: Diagnosis present

## 2022-10-01 DIAGNOSIS — D508 Other iron deficiency anemias: Secondary | ICD-10-CM

## 2022-10-01 DIAGNOSIS — D649 Anemia, unspecified: Secondary | ICD-10-CM | POA: Diagnosis not present

## 2022-10-01 MED ORDER — DIPHENHYDRAMINE HCL 25 MG PO CAPS
25.0000 mg | ORAL_CAPSULE | Freq: Once | ORAL | Status: AC
  Administered 2022-10-01: 25 mg via ORAL

## 2022-10-01 MED ORDER — ACETAMINOPHEN 325 MG PO TABS
650.0000 mg | ORAL_TABLET | Freq: Once | ORAL | Status: AC
  Administered 2022-10-01: 650 mg via ORAL

## 2022-10-01 MED ORDER — IRON SUCROSE 500 MG IVPB - SIMPLE MED
500.0000 mg | Freq: Once | INTRAVENOUS | Status: AC
  Administered 2022-10-01: 500 mg via INTRAVENOUS
  Filled 2022-10-01: qty 500

## 2022-10-01 NOTE — Progress Notes (Signed)
Diagnosis: Iron Deficiency Anemia  Provider:  Crawford Givens MD  Procedure: Infusion  IV Type: Peripheral, IV Location: R Antecubital  Venofer (Iron Sucrose), Dose: 500 mg  Infusion Start Time: 0908  Infusion Stop Time: [id number]  Post Infusion IV Care: Observation period completed and Peripheral IV Discontinued  Discharge: Condition: Good, Destination: Home . AVS Provided  Performed by:  Baxter Hire, RN

## 2022-10-06 ENCOUNTER — Ambulatory Visit (HOSPITAL_COMMUNITY)
Admission: RE | Admit: 2022-10-06 | Discharge: 2022-10-06 | Disposition: A | Discharge status: Home / Self Care | Payer: 59 | Source: Ambulatory Visit | Attending: Obstetrics and Gynecology | Admitting: Obstetrics and Gynecology

## 2022-10-06 DIAGNOSIS — N920 Excessive and frequent menstruation with regular cycle: Secondary | ICD-10-CM | POA: Insufficient documentation

## 2022-10-10 ENCOUNTER — Other Ambulatory Visit (HOSPITAL_COMMUNITY): Payer: Self-pay | Admitting: Obstetrics and Gynecology

## 2022-10-10 DIAGNOSIS — N920 Excessive and frequent menstruation with regular cycle: Secondary | ICD-10-CM

## 2022-10-17 ENCOUNTER — Encounter (HOSPITAL_COMMUNITY): Payer: Self-pay | Admitting: Obstetrics and Gynecology

## 2022-10-20 ENCOUNTER — Other Ambulatory Visit: Payer: Self-pay | Admitting: Obstetrics and Gynecology

## 2022-11-26 ENCOUNTER — Other Ambulatory Visit: Payer: Self-pay | Admitting: Obstetrics and Gynecology

## 2022-11-27 ENCOUNTER — Other Ambulatory Visit: Payer: Self-pay | Admitting: Obstetrics and Gynecology

## 2022-11-27 ENCOUNTER — Other Ambulatory Visit: Payer: Self-pay

## 2022-12-09 ENCOUNTER — Other Ambulatory Visit: Payer: Self-pay | Admitting: Obstetrics and Gynecology

## 2022-12-09 ENCOUNTER — Ambulatory Visit
Admission: RE | Admit: 2022-12-09 | Discharge: 2022-12-09 | Disposition: A | Discharge status: Home / Self Care | Payer: 59 | Source: Ambulatory Visit

## 2022-12-09 VITALS — BP 153/84 | HR 74 | Temp 98.3°F | Resp 18

## 2022-12-09 DIAGNOSIS — H6992 Unspecified Eustachian tube disorder, left ear: Secondary | ICD-10-CM

## 2022-12-09 DIAGNOSIS — R591 Generalized enlarged lymph nodes: Secondary | ICD-10-CM | POA: Diagnosis not present

## 2022-12-09 DIAGNOSIS — H938X2 Other specified disorders of left ear: Secondary | ICD-10-CM | POA: Diagnosis not present

## 2022-12-09 MED ORDER — PREDNISONE 20 MG PO TABS: 40.0000 mg | ORAL_TABLET | Freq: Every day | ORAL | 0 refills | Status: AC

## 2022-12-09 MED ORDER — FLUTICASONE PROPIONATE 50 MCG/ACT NA SUSP: 2.0000 | Freq: Every day | NASAL | 0 refills | Status: AC

## 2022-12-09 NOTE — Discharge Instructions (Addendum)
Take medication as prescribed. May apply warm compresses to the left ear/neck to help with pain or discomfort. May take over-the-counter Tylenol or ibuprofen as needed for pain, fever, general discomfort. As discussed, I would like for you to follow-up with your primary care physician as soon as possible to schedule a follow-up appointment for the swelling in the left side of your neck.  Based on your exam, an ultrasound of your neck is needed to determine the cause of the swelling. If you develop worsening swelling, have trouble swallowing, difficulty breathing, or other concerns before you can have your ultrasound, go to the emergency department immediately for further evaluation. Follow-up as needed.

## 2022-12-09 NOTE — ED Triage Notes (Signed)
Pt c/o ear fullness left ear, shoot pains in right ear pt states she was seen at her primary care last week, about a knot on her neck located on the left side, they checked her ears and throat, then went to urgent care. That following Saturday it didn't get better, she ws prescribed ear drops and antibiotics. The knot is Painful and hard.

## 2022-12-09 NOTE — ED Provider Notes (Signed)
[facility]    CSN: [id number] Arrival date & time: 12/09/22  1807      History   Chief Complaint Chief Complaint  Patient presents with   Ear Fullness    April 19th and 20th I went to the doctor regarding a knot right at my jawline near my throat and under my ear. Now my ear feels full, can't really hear well out of it. The lump is painful locally as well as in my ear I'm having ear aches. Help. - Entered by patient    HPI Breanna Lucero is a 47 y.o. female.   The history is provided by the patient.   The patient presents for complaints of left ear fullness and pressure, and a knot on the left side of her throat.  Symptoms have been present for more than 1 month.  Patient states she did see her doctor in April, and told her about her symptoms.  Patient states the doctor told her she may have an ear infection or strep throat, but no medications were prescribed.  Patient states subsequently, she followed up with a local urgent care, and was prescribed an antibiotic and eardrops.  She states after taking the medications, the left ear symptoms became worse.  Patient states that she has also noticed a "knot" on the left side of her throat immediately under the left ear.  She states that because of the "knot" on the left side of her neck, it is causing pressure and fullness in the left ear.  She states that the "knot" in her neck has changed.  She states it was "more squishy", and now it seems "more firm".  She states that the area on her neck is not tender.  Patient reports history of thyroid cancer and hypothyroidism.  She states that she did have blood work last month, and her levothyroxine was increased.  Patient states she is concerned because she is having difficulty hearing out of her ear.  Patient denies fever, chills, nasal congestion, runny nose, sore throat, cough, difficulty swallowing, difficulty breathing, abdominal pain, nausea, vomiting, or diarrhea.  Past Medical  History:  Diagnosis Date   Anemia    menorrhagia induced   ASCUS (atypical squamous cells of undetermined significance) on Pap smear    neg high risk hpv   Goiter    Heart murmur    as a child   Hypertension    Hypothyroid    Hypothyroid     Patient Active Problem List   Diagnosis Date Noted   Iron deficiency anemia 09/17/2022   Thyroid cancer (HCC) 09/07/2020   Heart murmur 11/11/2012   Anemia 11/11/2012   Hypothyroid 11/11/2012   History of abnormal Pap smear 11/11/2012   ANKLE SPRAIN, LEFT 06/03/2007    Past Surgical History:  Procedure Laterality Date   CESAREAN SECTION     x 2   COLPOSCOPY     THYROID SURGERY      OB History     Gravida  4   Para  3   Term  1   Preterm      AB  1   Living         SAB  1   IAB      Ectopic      Multiple      Live Births               Home Medications    Prior to Admission medications   Medication  Sig Start Date End Date Taking? Authorizing Provider  fluticasone (FLONASE) 50 MCG/ACT nasal spray Place 2 sprays into both nostrils daily. 12/09/22  Yes Jenniffer Vessels-Warren, Sadie Haber, NP  medroxyPROGESTERone (PROVERA) 10 MG tablet Take 10 mg by mouth daily. 09/29/22  Yes [provider]  predniSONE (DELTASONE) 20 MG tablet Take 2 tablets (40 mg total) by mouth daily with breakfast for 5 days. 12/09/22 12/14/22 Yes Naseem Varden-Warren, Sadie Haber, NP  iron polysaccharides (NIFEREX) 150 MG capsule Take 150 mg by mouth daily.    [provider]  levothyroxine (SYNTHROID) 125 MCG tablet Take 1 tablet (125 mcg total) by mouth daily. 09/07/20   Romero Belling, MD  valsartan-hydrochlorothiazide (DIOVAN-HCT) 320-12.5 MG tablet Take 1 tablet by mouth daily.    [provider]    Family History Family History  Problem Relation Age of Onset   Heart disease Father    Thyroid disease Father     Social History Social History   Tobacco Use   Smoking status: Never   Smokeless tobacco: Never  Vaping Use    Vaping Use: Never used  Substance Use Topics   Alcohol use: No    Alcohol/week: 0.0 standard drinks of alcohol   Drug use: No     Allergies   Patient has no known allergies.   Review of Systems Review of Systems Per HPI  Physical Exam Triage Vital Signs ED Triage Vitals  Enc Vitals Group     BP 12/09/22 1811 (!) 153/84     Pulse Rate 12/09/22 1811 74     Resp 12/09/22 1811 18     Temp 12/09/22 1811 98.3 F (36.8 C)     Temp Source 12/09/22 1811 Oral     SpO2 12/09/22 1811 96 %     Weight --      Height --      Head Circumference --      Peak Flow --      Pain Score 12/09/22 1814 6     Pain Loc --      Pain Edu? --      Excl. in GC? --    No data found.  Updated Vital Signs BP (!) 153/84 (BP Location: Right Arm)   Pulse 74   Temp 98.3 F (36.8 C) (Oral)   Resp 18   LMP 12/08/2022 (Exact Date)   SpO2 96%   Visual Acuity Right Eye Distance:   Left Eye Distance:   Bilateral Distance:    Right Eye Near:   Left Eye Near:    Bilateral Near:     Physical Exam Vitals and nursing note reviewed.  Constitutional:      General: She is not in acute distress.    Appearance: Normal appearance.  HENT:     Head: Normocephalic.     Right Ear: Tympanic membrane, ear canal and external ear normal.     Left Ear: Ear canal and external ear normal.     Nose: Nose normal.     Mouth/Throat:     Mouth: Mucous membranes are moist.     Comments: Airway is clear and patent, no obstruction noted. Eyes:     Extraocular Movements: Extraocular movements intact.     Conjunctiva/sclera: Conjunctivae normal.     Pupils: Pupils are equal, round, and reactive to light.  Cardiovascular:     Rate and Rhythm: Normal rate and regular rhythm.     Pulses: Normal pulses.     Heart sounds: Normal heart sounds.  Pulmonary:  Effort: Pulmonary effort is normal. No respiratory distress.     Breath sounds: Normal breath sounds. No stridor. No wheezing, rhonchi or rales.  Abdominal:      General: Bowel sounds are normal.     Palpations: Abdomen is soft.     Tenderness: There is no abdominal tenderness.  Musculoskeletal:     Cervical back: Normal range of motion.  Lymphadenopathy:     Head:     Left side of head: Submandibular adenopathy present.  Skin:    General: Skin is warm and dry.  Neurological:     General: No focal deficit present.     Mental Status: She is alert and oriented to person, place, and time.  Psychiatric:        Mood and Affect: Mood normal.        Behavior: Behavior normal.      UC Treatments / Results  Labs (all labs ordered are listed, but only abnormal results are displayed) Labs Reviewed - No data to display  EKG   Radiology No results found.  Procedures Procedures (including critical care time)  Medications Ordered in UC Medications - No data to display  Initial Impression / Assessment and Plan / UC Course  I have reviewed the triage vital signs and the nursing notes.  Pertinent labs & imaging results that were available during my care of the patient were reviewed by me and considered in my medical decision making (see chart for details).  The patient is well-appearing, she is in no acute distress, she is hypertensive, but vital signs are otherwise stable.  Will treat patient with prednisone 40 mg for the next 5 days along with fluticasone 50 mcg nasal spray to help with possible eustachian tube dysfunction.  There is concern for the swelling in the left side of the patient's neck consistent with lymphadenopathy.  Patient has a history of thyroid cancer, and underactive thyroid.  Patient was advised to call her PCP tomorrow for follow-up and to have an ultrasound of her neck.  Patient was given strict ER follow-up precautions.  Patient is in agreement with this plan of care and verbalizes understanding.  All questions were answered.  Patient stable for discharge.   Final Clinical Impressions(s) / UC Diagnoses   Final  diagnoses:  Ear fullness, left  Acute dysfunction of left eustachian tube  Lymphadenopathy     Discharge Instructions      Take medication as prescribed. May apply warm compresses to the left ear/neck to help with pain or discomfort. May take over-the-counter Tylenol or ibuprofen as needed for pain, fever, general discomfort. As discussed, I would like for you to follow-up with your primary care physician as soon as possible to schedule a follow-up appointment for the swelling in the left side of your neck.  Based on your exam, an ultrasound of your neck is needed to determine the cause of the swelling. If you develop worsening swelling, have trouble swallowing, difficulty breathing, or other concerns before you can have your ultrasound, go to the emergency department immediately for further evaluation. Follow-up as needed.     ED Prescriptions     Medication Sig Dispense Auth. Provider   fluticasone (FLONASE) 50 MCG/ACT nasal spray Place 2 sprays into both nostrils daily. 16 g Shequita Peplinski-Warren, Sadie Haber, NP   predniSONE (DELTASONE) 20 MG tablet Take 2 tablets (40 mg total) by mouth daily with breakfast for 5 days. 10 tablet Nicki Gracy-Warren, Sadie Haber, NP      PDMP not  reviewed this encounter.   Abran Cantor, NP 12/09/22 1954

## 2022-12-10 ENCOUNTER — Other Ambulatory Visit: Payer: Self-pay

## 2022-12-10 ENCOUNTER — Other Ambulatory Visit: Payer: Self-pay | Admitting: Obstetrics and Gynecology

## 2022-12-12 ENCOUNTER — Other Ambulatory Visit: Payer: Self-pay | Admitting: Family Medicine

## 2022-12-12 ENCOUNTER — Encounter (HOSPITAL_COMMUNITY): Admission: RE | Admit: 2022-12-12 | Payer: 59 | Source: Ambulatory Visit

## 2022-12-12 ENCOUNTER — Encounter: Payer: Self-pay | Admitting: Physician Assistant

## 2022-12-12 DIAGNOSIS — R221 Localized swelling, mass and lump, neck: Secondary | ICD-10-CM

## 2022-12-17 ENCOUNTER — Encounter (HOSPITAL_COMMUNITY)
Admission: RE | Admit: 2022-12-17 | Discharge: 2022-12-17 | Disposition: A | Discharge status: Home / Self Care | Payer: 59 | Source: Ambulatory Visit | Attending: Obstetrics and Gynecology | Admitting: Obstetrics and Gynecology

## 2022-12-17 VITALS — BP 120/75 | HR 71 | Temp 98.3°F | Resp 18

## 2022-12-17 DIAGNOSIS — D508 Other iron deficiency anemias: Secondary | ICD-10-CM | POA: Insufficient documentation

## 2022-12-17 MED ORDER — ACETAMINOPHEN 325 MG PO TABS
650.0000 mg | ORAL_TABLET | Freq: Once | ORAL | Status: AC
  Administered 2022-12-17: 650 mg via ORAL

## 2022-12-17 MED ORDER — IRON SUCROSE 500 MG IVPB - SIMPLE MED
500.0000 mg | Freq: Once | INTRAVENOUS | Status: AC
  Administered 2022-12-17: 500 mg via INTRAVENOUS
  Filled 2022-12-17: qty 400

## 2022-12-17 MED ORDER — DIPHENHYDRAMINE HCL 25 MG PO CAPS
25.0000 mg | ORAL_CAPSULE | Freq: Once | ORAL | Status: AC
  Administered 2022-12-17: 25 mg via ORAL

## 2022-12-17 NOTE — Addendum Note (Signed)
Encounter addended by: Wyvonne Lenz, RN on: 12/17/2022 2:15 PM  Actions taken: Therapy plan modified

## 2022-12-17 NOTE — Progress Notes (Signed)
Diagnosis: Iron Deficiency Anemia  Provider:  Jaymes Graff MD  Procedure: IV Infusion  IV Type: Peripheral, IV Location: R Antecubital  Venofer (Iron Sucrose), Dose: 500 mg  Infusion Start Time: 0916  Infusion Stop Time: 1258  Post Infusion IV Care: Peripheral IV Discontinued  Discharge: Condition: Good, Destination: Home . AVS Declined  Performed by:  Marlow Baars [place]king[place]-Burchett, RN

## 2022-12-24 ENCOUNTER — Ambulatory Visit
Admission: RE | Admit: 2022-12-24 | Discharge: 2022-12-24 | Disposition: A | Discharge status: Home / Self Care | Payer: 59 | Source: Ambulatory Visit | Attending: Family Medicine | Admitting: Family Medicine

## 2022-12-24 DIAGNOSIS — R221 Localized swelling, mass and lump, neck: Secondary | ICD-10-CM

## 2022-12-25 ENCOUNTER — Other Ambulatory Visit: Payer: Self-pay | Admitting: Family Medicine

## 2022-12-25 DIAGNOSIS — R221 Localized swelling, mass and lump, neck: Secondary | ICD-10-CM

## 2022-12-26 ENCOUNTER — Ambulatory Visit
Admission: RE | Admit: 2022-12-26 | Discharge: 2022-12-26 | Disposition: A | Discharge status: Home / Self Care | Payer: 59 | Source: Ambulatory Visit | Attending: Family Medicine | Admitting: Family Medicine

## 2022-12-26 DIAGNOSIS — R221 Localized swelling, mass and lump, neck: Secondary | ICD-10-CM

## 2022-12-26 MED ORDER — IOPAMIDOL (ISOVUE-300) INJECTION 61%
75.0000 mL | Freq: Once | INTRAVENOUS | Status: AC | PRN
  Administered 2022-12-26: 75 mL via INTRAVENOUS

## 2022-12-26 NOTE — Pre-Procedure Instructions (Signed)
Surgical Instructions    Your procedure is scheduled on January 05, 2023.  Report to [facility] Main Entrance "A" at 8:15 A.M., then check in with the Admitting office.  Call this number if you have problems the morning of surgery:  [phone]  If you have any questions prior to your surgery date call [phone]: Open Monday-Friday 8am-4pm If you experience any cold or flu symptoms such as cough, fever, chills, shortness of breath, etc. between now and your scheduled surgery, please notify us at the above number.     Remember:  Do not eat or drink after midnight the night before your surgery     Take these medicines the morning of surgery with A SIP OF WATER:  levothyroxine (SYNTHROID)   fluticasone (FLONASE) nasal spray - may take if needed   As of today, STOP taking any Aspirin (unless otherwise instructed by your surgeon) Aleve, Naproxen, Ibuprofen, Motrin, Advil, Goody's, BC's, all herbal medications, fish oil, and all vitamins.                     Do NOT Smoke (Tobacco/Vaping) for 24 hours prior to your procedure.  If you use a CPAP at night, you may bring your mask/headgear for your overnight stay.   Contacts, glasses, piercing's, hearing aid's, dentures or partials may not be worn into surgery, please bring cases for these belongings.    For patients admitted to the hospital, discharge time will be determined by your treatment team.   Patients discharged the day of surgery will not be allowed to drive home, and someone needs to stay with them for 24 hours.  SURGICAL WAITING ROOM VISITATION Patients having surgery or a procedure may have no more than 2 support people in the waiting area - these visitors may rotate.   Children under the age of 95 must have an adult with them who is not the patient. If the patient needs to stay at the hospital during part of their recovery, the visitor guidelines for inpatient rooms apply. Pre-op nurse will coordinate an appropriate time for  1 support person to accompany patient in pre-op.  This support person may not rotate.   Please refer to the [facility] website for the visitor guidelines for Inpatients (after your surgery is over and you are in a regular room).    Special instructions:   [facility]- Preparing For Surgery  Before surgery, you can play an important role. Because skin is not sterile, your skin needs to be as free of germs as possible. You can reduce the number of germs on your skin by washing with CHG (chlorahexidine gluconate) Soap before surgery.  CHG is an antiseptic cleaner which kills germs and bonds with the skin to continue killing germs even after washing.    Oral Hygiene is also important to reduce your risk of infection.  Remember - BRUSH YOUR TEETH THE MORNING OF SURGERY WITH YOUR REGULAR TOOTHPASTE  Please do not use if you have an allergy to CHG or antibacterial soaps. If your skin becomes reddened/irritated stop using the CHG.  Do not shave (including legs and underarms) for at least 48 hours prior to first CHG shower. It is OK to shave your face.  Please follow these instructions carefully.   Shower the NIGHT BEFORE SURGERY and the MORNING OF SURGERY  If you chose to wash your hair, wash your hair first as usual with your normal shampoo.  After you shampoo, rinse your hair and body thoroughly  to remove the shampoo.  Use CHG Soap as you would any other liquid soap. You can apply CHG directly to the skin and wash gently with a scrungie or a clean washcloth.   Apply the CHG Soap to your body ONLY FROM THE NECK DOWN.  Do not use on open wounds or open sores. Avoid contact with your eyes, ears, mouth and genitals (private parts). Wash Face and genitals (private parts)  with your normal soap.   Wash thoroughly, paying special attention to the area where your surgery will be performed.  Thoroughly rinse your body with warm water from the neck down.  DO NOT shower/wash with your normal soap after  using and rinsing off the CHG Soap.  Pat yourself dry with a CLEAN TOWEL.  Wear CLEAN PAJAMAS to bed the night before surgery  Place CLEAN SHEETS on your bed the night before your surgery  DO NOT SLEEP WITH PETS.   Day of Surgery: Take a shower with CHG soap. Do not wear jewelry or makeup Do not wear lotions, powders, perfumes/colognes, or deodorant. Do not shave 48 hours prior to surgery.  Men may shave face and neck. Do not bring valuables to the hospital.  [facility] is not responsible for any belongings or valuables. Do not wear nail polish, gel polish, artificial nails, or any other type of covering on natural nails (fingers and toes) If you have artificial nails or gel coating that need to be removed by a nail salon, please have this removed prior to surgery. Artificial nails or gel coating may interfere with anesthesia's ability to adequately monitor your vital signs.  Wear Clean/Comfortable clothing the morning of surgery Remember to brush your teeth WITH YOUR REGULAR TOOTHPASTE.   Please read over the following fact sheets that you were given.    If you received a COVID test during your pre-op visit  it is requested that you wear a mask when out in public, stay away from anyone that may not be feeling well and notify your surgeon if you develop symptoms. If you have been in contact with anyone that has tested positive in the last 10 days please notify you surgeon.

## 2022-12-30 ENCOUNTER — Encounter (HOSPITAL_COMMUNITY): Payer: Self-pay

## 2022-12-30 ENCOUNTER — Encounter (HOSPITAL_COMMUNITY)
Admission: RE | Admit: 2022-12-30 | Discharge: 2022-12-30 | Disposition: A | Discharge status: Home / Self Care | Payer: 59 | Source: Ambulatory Visit | Attending: Obstetrics and Gynecology | Admitting: Obstetrics and Gynecology

## 2022-12-30 ENCOUNTER — Other Ambulatory Visit: Payer: Self-pay

## 2022-12-30 VITALS — BP 128/80 | HR 71 | Temp 98.4°F | Resp 17 | Ht 63.5 in | Wt 220.0 lb

## 2022-12-30 DIAGNOSIS — Z01818 Encounter for other preprocedural examination: Secondary | ICD-10-CM | POA: Diagnosis present

## 2022-12-30 DIAGNOSIS — D259 Leiomyoma of uterus, unspecified: Secondary | ICD-10-CM | POA: Insufficient documentation

## 2022-12-30 DIAGNOSIS — D500 Iron deficiency anemia secondary to blood loss (chronic): Secondary | ICD-10-CM | POA: Insufficient documentation

## 2022-12-30 DIAGNOSIS — I10 Essential (primary) hypertension: Secondary | ICD-10-CM | POA: Diagnosis not present

## 2022-12-30 DIAGNOSIS — N920 Excessive and frequent menstruation with regular cycle: Secondary | ICD-10-CM | POA: Insufficient documentation

## 2022-12-30 DIAGNOSIS — E890 Postprocedural hypothyroidism: Secondary | ICD-10-CM | POA: Insufficient documentation

## 2022-12-30 LAB — CBC
HCT: 36.9 % (ref 36.0–46.0)
Hemoglobin: 11.4 g/dL — ABNORMAL LOW (ref 12.0–15.0)
MCH: 26.3 pg (ref 26.0–34.0)
MCHC: 30.9 g/dL (ref 30.0–36.0)
MCV: 85.2 fL (ref 80.0–100.0)
Platelets: 306 10*3/uL (ref 150–400)
RBC: 4.33 MIL/uL (ref 3.87–5.11)
RDW: 16.7 % — ABNORMAL HIGH (ref 11.5–15.5)
WBC: 5.4 10*3/uL (ref 4.0–10.5)
nRBC: 0 % (ref 0.0–0.2)

## 2022-12-30 LAB — TYPE AND SCREEN
ABO/RH(D): O POS
Antibody Screen: NEGATIVE

## 2022-12-30 LAB — BASIC METABOLIC PANEL
Anion gap: 7 (ref 5–15)
BUN: 9 mg/dL (ref 6–20)
CO2: 24 mmol/L (ref 22–32)
Calcium: 9.2 mg/dL (ref 8.9–10.3)
Chloride: 102 mmol/L (ref 98–111)
Creatinine, Ser: 0.82 mg/dL (ref 0.44–1.00)
GFR, Estimated: >60 mL/min (ref >60)
Glucose, Bld: 77 mg/dL (ref 70–99)
Potassium: 3.9 mmol/L (ref 3.5–5.1)
Sodium: 133 mmol/L — ABNORMAL LOW (ref 135–145)

## 2022-12-30 NOTE — Progress Notes (Signed)
PCP - Elias Else Cardiologist - denies  PPM/ICD - denies Device Orders - n/a Rep Notified - n/a  Chest x-ray - denies EKG - 12/26/2022 Stress Test - denies ECHO - denies Cardiac Cath - denies  Sleep Study - denies CPAP - n/a  Fasting Blood Sugar - no DM   Last dose of GLP1 agonist-  n/a GLP1 instructions: n/a  Blood Thinner Instructions: n/a Aspirin Instructions: n/a  ERAS Protcol -no; NPO PRE-SURGERY Ensure or G2- no  COVID TEST- n/a   Anesthesia review: yes. Breanna Endo, PA saw the pt. hx of heart murmur as a child (has been resolved) and recent visit to urgent care (swollen lymph node).    Patient denies shortness of breath, fever, cough and chest pain at PAT appointment and the last two months.    All instructions explained to the patient, with a verbal understanding of the material. Patient agrees to go over the instructions while at home for a better understanding. Patient also instructed to self quarantine after being tested for COVID-19. The opportunity to ask questions was provided.

## 2022-12-31 ENCOUNTER — Encounter (HOSPITAL_COMMUNITY): Payer: Self-pay

## 2022-12-31 ENCOUNTER — Other Ambulatory Visit (HOSPITAL_COMMUNITY): Payer: Self-pay | Admitting: Family Medicine

## 2022-12-31 DIAGNOSIS — R221 Localized swelling, mass and lump, neck: Secondary | ICD-10-CM

## 2022-12-31 DIAGNOSIS — R9389 Abnormal findings on diagnostic imaging of other specified body structures: Secondary | ICD-10-CM

## 2022-12-31 DIAGNOSIS — R599 Enlarged lymph nodes, unspecified: Secondary | ICD-10-CM

## 2022-12-31 NOTE — Progress Notes (Signed)
Case: [id number] Date/Time: 01/05/23 1000   Procedures:      LAPAROSCOPIC ASSISTED VAGINAL HYSTERECTOMY WITH SALPINGECTOMY (Bilateral) - G[facility] MATS     CYSTOSCOPY     HYSTERECTOMY ABDOMINAL (Bilateral)   Anesthesia type: General   Pre-op diagnosis: EXCESSIVE AND FREQUENT MENSTRUATION WITH REGULAR CYCLE   Location: [facility] OR ROOM 08 / [facility]   Surgeons: Jaymes Graff, MD       DISCUSSION: Breanna Lucero is a 47 year old female who presents to PAT prior to surgery listed above.  Patient has a history of menorrhagia due to fibroids resulting in chronic anemia.  She is now scheduled for hysterectomy.   No prior anesthesia complications.   Other past medical history includes hypertension, hypothyroidism status post thyroidectomy due to cancer in 2004. Patient follows with her PCP Dr. Azucena Cecil for her chronic medical issues.  Of note she is currently being worked up for potential malignancy. She has an enlarged lymph node on the left side of her neck and had an ultrasound and CT scan which were concerning.  Now scheduled for biopsy of this area.  Patient was seen in PAT due to history of heart murmur as a child.  She has not had any workup for this and was just told this by her mother.  She denies any chest pain, shortness of breath, lightheadedness or syncope.  No murmur was auscultated on exam.  VS: BP 128/80   Pulse 71   Temp 36.9 C   Resp 17   Ht 5' 3.5" (1.613 m)   Wt 99.8 kg   LMP 12/08/2022 (Exact Date)   SpO2 100%   BMI 38.36 kg/m   PROVIDERS: [name], MD   LABS: Labs reviewed: Acceptable for surgery. (all labs ordered are listed, but only abnormal results are displayed)  Labs Reviewed  CBC - Abnormal; Notable for the following components:      Result Value   Hemoglobin 11.4 (*)    RDW 16.7 (*)    All other components within normal limits  BASIC METABOLIC PANEL - Abnormal; Notable for the following components:   Sodium 133 (*)    All other components within normal limits   TYPE AND SCREEN     IMAGES:  CT soft tissue neck 12/26/22:    IMPRESSION: Pathologic left level IIa lymph node concerning for metastatic disease. Recommend tissue sampling as well as visual inspection of the pharynx to assess for a primary mucosal lesion.  Pelvis US 10/06/22:  IMPRESSION: 1. Enlarged uterus with multiple uterine fibroids. The 3 largest fibroids measure 5.6 cm, 4.8 cm, and 3.6 cm respectively. 2. Endometrial stripe is normal in thickness at 5 mm. 3. 3.6 cm simple cyst right ovary. 4. 3.7 cm thinly septated simple cyst left ovary cannot be excluded.     EKG 12/31/22:  NSR   CV: n/a  Past Medical History:  Diagnosis Date   Anemia    menorrhagia induced   ASCUS (atypical squamous cells of undetermined significance) on Pap smear    neg high risk hpv   Cancer (HCC) 09/2002   Thyroid   Goiter    Heart murmur    as a child   Hypertension    Hypothyroid    Hypothyroid     Past Surgical History:  Procedure Laterality Date   CESAREAN SECTION     x 2   COLPOSCOPY     THYROID SURGERY  2004    MEDICATIONS:  ferrous sulfate 325 (65 FE) MG tablet  fluticasone (FLONASE) 50 MCG/ACT nasal spray   Garlic 100 MG TABS   ibuprofen (ADVIL) 200 MG tablet   levothyroxine (SYNTHROID) 125 MCG tablet   progesterone (PROMETRIUM) 200 MG capsule   valsartan-hydrochlorothiazide (DIOVAN-HCT) 320-12.5 MG tablet   Vitamin D, Ergocalciferol, (DRISDOL) 1.25 MG (50000 UNIT) CAPS capsule   No current facility-administered medications for this encounter.   Marcille Blanco [facility] Surgical Short Stay/Anesthesiology [facility] Phone [phone] 12/31/2022 1:05 PM

## 2022-12-31 NOTE — Progress Notes (Signed)
Breanna Mor, DO  Leodis Rains D; P [facility] Procedure Requests Technically this is a "deep neck biopsy" based on the space involved.  OK for CT guided left neck mass biopsy.  With Dr. Loreta Ave only.   [place] available in the room.  Loreta Ave

## 2022-12-31 NOTE — Anesthesia Preprocedure Evaluation (Addendum)
Anesthesia Evaluation  Patient identified by MRN, date of birth, ID band Patient awake    Reviewed: Allergy & Precautions, NPO status , Patient's Chart, lab work & pertinent test results  Airway Mallampati: I  TM Distance: >3 FB Neck ROM: Full    Dental  (+) Teeth Intact, Dental Advisory Given   Pulmonary neg pulmonary ROS   breath sounds clear to auscultation       Cardiovascular hypertension, Pt. on medications + Valvular Problems/Murmurs  Rhythm:Regular Rate:Normal     Neuro/Psych negative neurological ROS  negative psych ROS   GI/Hepatic negative GI ROS, Neg liver ROS,,,  Endo/Other  Hypothyroidism    Renal/GU negative Renal ROS     Musculoskeletal negative musculoskeletal ROS (+)    Abdominal   Peds  Hematology negative hematology ROS (+)   Anesthesia Other Findings   Reproductive/Obstetrics                             Anesthesia Physical Anesthesia Plan  ASA: 3  Anesthesia Plan:    Post-op Pain Management: Tylenol PO (pre-op)* and Toradol IV (intra-op)*   Induction: Intravenous  PONV Risk Score and Plan: 3 and Ondansetron, Dexamethasone and Midazolam  Airway Management Planned: Oral ETT  Additional Equipment: None  Intra-op Plan:   Post-operative Plan: Extubation in OR  Informed Consent: I have reviewed the patients History and Physical, chart, labs and discussed the procedure including the risks, benefits and alternatives for the proposed anesthesia with the patient or authorized representative who has indicated his/her understanding and acceptance.     Dental advisory given  Plan Discussed with: CRNA  Anesthesia Plan Comments: (See PAT note from 5/28 by Sherlie Ban PA-C )        Anesthesia Quick Evaluation

## 2023-01-02 ENCOUNTER — Inpatient Hospital Stay (HOSPITAL_BASED_OUTPATIENT_CLINIC_OR_DEPARTMENT_OTHER): Payer: 59 | Admitting: Physician Assistant

## 2023-01-02 ENCOUNTER — Inpatient Hospital Stay: Payer: 59

## 2023-01-02 VITALS — BP 161/86 | HR 66 | Temp 97.9°F | Resp 17 | Ht 63.5 in | Wt 218.7 lb

## 2023-01-02 DIAGNOSIS — D508 Other iron deficiency anemias: Secondary | ICD-10-CM

## 2023-01-02 DIAGNOSIS — Z85850 Personal history of malignant neoplasm of thyroid: Secondary | ICD-10-CM

## 2023-01-02 DIAGNOSIS — D649 Anemia, unspecified: Secondary | ICD-10-CM

## 2023-01-02 DIAGNOSIS — R221 Localized swelling, mass and lump, neck: Secondary | ICD-10-CM

## 2023-01-02 DIAGNOSIS — R591 Generalized enlarged lymph nodes: Secondary | ICD-10-CM

## 2023-01-02 DIAGNOSIS — Z79899 Other long term (current) drug therapy: Secondary | ICD-10-CM | POA: Insufficient documentation

## 2023-01-02 LAB — CMP (CANCER CENTER ONLY)
ALT: 18 U/L (ref 0–44)
AST: 19 U/L (ref 15–41)
Albumin: 4.5 g/dL (ref 3.5–5.0)
Alkaline Phosphatase: 63 U/L (ref 38–126)
Anion gap: 10 (ref 5–15)
BUN: 14 mg/dL (ref 6–20)
CO2: 24 mmol/L (ref 22–32)
Calcium: 9.7 mg/dL (ref 8.9–10.3)
Chloride: 102 mmol/L (ref 98–111)
Creatinine: 0.83 mg/dL (ref 0.44–1.00)
GFR, Estimated: >60 mL/min (ref >60)
Glucose, Bld: 86 mg/dL (ref 70–99)
Potassium: 3.5 mmol/L (ref 3.5–5.1)
Sodium: 136 mmol/L (ref 135–145)
Total Bilirubin: 0.4 mg/dL (ref 0.3–1.2)
Total Protein: 7.9 g/dL (ref 6.5–8.1)

## 2023-01-02 LAB — CBC WITH DIFFERENTIAL (CANCER CENTER ONLY)
Abs Immature Granulocytes: 0.02 10*3/uL (ref 0.00–0.07)
Basophils Absolute: 0 10*3/uL (ref 0.0–0.1)
Basophils Relative: 1 %
Eosinophils Absolute: 0.1 10*3/uL (ref 0.0–0.5)
Eosinophils Relative: 1 %
HCT: 34.8 % — ABNORMAL LOW (ref 36.0–46.0)
Hemoglobin: 11.5 g/dL — ABNORMAL LOW (ref 12.0–15.0)
Immature Granulocytes: 0 %
Lymphocytes Relative: 27 %
Lymphs Abs: 1.9 10*3/uL (ref 0.7–4.0)
MCH: 27.2 pg (ref 26.0–34.0)
MCHC: 33 g/dL (ref 30.0–36.0)
MCV: 82.3 fL (ref 80.0–100.0)
Monocytes Absolute: 0.4 10*3/uL (ref 0.1–1.0)
Monocytes Relative: 6 %
Neutro Abs: 4.6 10*3/uL (ref 1.7–7.7)
Neutrophils Relative %: 65 %
Platelet Count: 347 10*3/uL (ref 150–400)
RBC: 4.23 MIL/uL (ref 3.87–5.11)
RDW: 16.1 % — ABNORMAL HIGH (ref 11.5–15.5)
WBC Count: 7 10*3/uL (ref 4.0–10.5)
nRBC: 0 % (ref 0.0–0.2)

## 2023-01-02 LAB — SEDIMENTATION RATE: Sed Rate: 27 mm/hr — ABNORMAL HIGH (ref 0–22)

## 2023-01-02 LAB — HEPATITIS B SURFACE ANTIBODY,QUALITATIVE: Hep B S Ab: NONREACTIVE

## 2023-01-02 LAB — HEPATITIS B SURFACE ANTIGEN: Hepatitis B Surface Ag: NONREACTIVE

## 2023-01-02 LAB — FERRITIN: Ferritin: 129 ng/mL (ref 11–307)

## 2023-01-02 LAB — IRON AND IRON BINDING CAPACITY (CC-WL,HP ONLY)
Iron: 39 ug/dL (ref 28–170)
Saturation Ratios: 12 % (ref 10.4–31.8)
TIBC: 330 ug/dL (ref 250–450)
UIBC: 291 ug/dL (ref 148–442)

## 2023-01-02 LAB — C-REACTIVE PROTEIN: CRP: 1.2 mg/dL — ABNORMAL HIGH (ref <1.0)

## 2023-01-02 LAB — HEPATITIS C ANTIBODY: HCV Ab: NONREACTIVE

## 2023-01-02 LAB — HEPATITIS B CORE ANTIBODY, TOTAL: Hep B Core Total Ab: NONREACTIVE

## 2023-01-02 LAB — HIV ANTIBODY (ROUTINE TESTING W REFLEX): HIV Screen 4th Generation wRfx: NONREACTIVE

## 2023-01-02 LAB — LACTATE DEHYDROGENASE: LDH: 159 U/L (ref 98–192)

## 2023-01-02 NOTE — Progress Notes (Signed)
[facility] [phone]   [phone]  INITIAL CONSULTATION:  Patient Care Team: [name], MD as PCP - General (Family Medicine)  CHIEF COMPLAINTS/PURPOSE OF CONSULTATION:  "Neck swelling "  HISTORY OF PRESENTING ILLNESS:  Breanna Lucero 47 y.o. female with medical history significant for thyroid cancer in 2004, surgical hypothyroidism, anemia, hypertension, ASCUS.  On review of the previous records patient has a history of papillary thyroid cancer diagnosed in 2004.  She underwent thyroidectomy and radioactive iodine seed ablation with Dr. Patrecia Pace.  She has been following with Dr. Claiborne Rigg endocrinology with treating him and is currently under active surveillance.  She is taking Synthroid and as she is greater than 10 years from diagnosis she has a low risk of recurrence per endocrinologist. Patient first reported her symptoms at physical on 11/21/22. She then had 2 urgent care visits and was treated with course of penicillin, prednisone, and Flonase with improvement of her symptoms   Patient is presenting unaccompanied for exam today.  She endorses intermittent pain on the left side of her neck that she describes as a pressure sensation.  Sometimes the pain will radiate to her chest.  She has been taking Tylenol and ibuprofen for the pain which does give mild relief.  Patient has not had any recent fever or illness that she can recall.  She denies any difficulty breathing or swallowing.  She occasionally feels as if she needs to clear her throat.  She denies any fever, night sweats or weight loss. Patient reports she is scheduled for a hysterectomy in 3 days because of her history of heavy periods and fibroids.  Further discussion patient reports being a never smoker and does not drink alcohol.  Her family history includes mother having breast cancer and was diagnosed in her early 90s.  She has a half-brother on her father side that  she believes passed away from lymphoma in his 86s.  MEDICAL HISTORY:  Past Medical History:  Diagnosis Date   Anemia    menorrhagia induced   ASCUS (atypical squamous cells of undetermined significance) on Pap smear    neg high risk hpv   Cancer (HCC) 09/2002   Thyroid   Goiter    Heart murmur    as a child   Hypertension    Hypothyroid    Hypothyroid     SURGICAL HISTORY: Past Surgical History:  Procedure Laterality Date   CESAREAN SECTION     x 2   COLPOSCOPY     THYROID SURGERY  2004    SOCIAL HISTORY: Social History   Socioeconomic History   Marital status: Married    Spouse name: Not on file   Number of children: Not on file   Years of education: Not on file   Highest education level: Not on file  Occupational History   Not on file  Tobacco Use   Smoking status: Never   Smokeless tobacco: Never  Vaping Use   Vaping Use: Never used  Substance and Sexual Activity   Alcohol use: No    Alcohol/week: 0.0 standard drinks of alcohol   Drug use: No   Sexual activity: Yes    Birth control/protection: None  Other Topics Concern   Not on file  Social History Narrative   Not on file   Social Determinants of Health   Financial Resource Strain: Not on file  Food Insecurity: Not on file  Transportation Needs: Not on file  Physical Activity: Not on  file  Stress: Not on file  Social Connections: Not on file  Intimate Partner Violence: Not on file    FAMILY HISTORY: Family History  Problem Relation Age of Onset   Heart disease Father    Thyroid disease Father     ALLERGIES:  has No Known Allergies.  MEDICATIONS:  Current Outpatient Medications  Medication Sig Dispense Refill   ferrous sulfate 325 (65 FE) MG tablet Take 325 mg by mouth 3 (three) times daily with meals.     fluticasone (FLONASE) 50 MCG/ACT nasal spray Place 2 sprays into both nostrils daily. (Patient taking differently: Place 2 sprays into both nostrils daily as needed for allergies.)  16 g 0   Garlic 100 MG TABS Take 100 mg by mouth 2 (two) times daily.     ibuprofen (ADVIL) 200 MG tablet Take 400-800 mg by mouth every 6 (six) hours as needed for mild pain or moderate pain.     levothyroxine (SYNTHROID) 125 MCG tablet Take 1 tablet (125 mcg total) by mouth daily. (Patient taking differently: Take 150 mcg by mouth daily. "Must take the Brand") 90 tablet 3   progesterone (PROMETRIUM) 200 MG capsule Take 200 mg by mouth at bedtime.     valsartan-hydrochlorothiazide (DIOVAN-HCT) 320-12.5 MG tablet Take 1 tablet by mouth daily.     Vitamin D, Ergocalciferol, (DRISDOL) 1.25 MG (50000 UNIT) CAPS capsule Take 50,000 Units by mouth once a week.     No current facility-administered medications for this visit.    REVIEW OF SYSTEMS:   All other systems are reviewed and are negative for acute change except as noted in the HPI.  PHYSICAL EXAMINATION: ECOG PERFORMANCE STATUS: 1 - Symptomatic but completely ambulatory  Vitals:   01/02/23 1232 01/02/23 1233  BP: (!) 161/97 (!) 161/86  Pulse: 66 66  Resp: 17   Temp: 97.9 F (36.6 C)   SpO2: 100%    Filed Weights   01/02/23 1232  Weight: 218 lb 11.2 oz (99.2 kg)    Physical Exam Constitutional:      Appearance: She is not ill-appearing or toxic-appearing.  HENT:     Head: Normocephalic.     Right Ear: Tympanic membrane and external ear normal.     Left Ear: Tympanic membrane and external ear normal.     Nose: Nose normal.     Mouth/Throat:     Mouth: Mucous membranes are moist.     Dentition: No gingival swelling or dental caries.     Tongue: No lesions.     Palate: No mass and lesions.     Pharynx: Oropharynx is clear. No uvula swelling.  Eyes:     General: No scleral icterus. Neck:     Comments: Approximately 2 cm nodule on left lateral neck Cardiovascular:     Rate and Rhythm: Normal rate and regular rhythm.     Pulses: Normal pulses.     Heart sounds: Normal heart sounds.  Pulmonary:     Effort: Pulmonary  effort is normal.     Breath sounds: Normal breath sounds.  Abdominal:     General: There is no distension.  Musculoskeletal:        General: Normal range of motion.     Cervical back: Normal range of motion and neck supple.  Neurological:     Mental Status: She is alert.       LABORATORY DATA:  I have reviewed the data as listed    Latest Ref Rng & Units 12/30/2022  3:29 PM 11/12/2012   11:18 AM 09/15/2008    5:40 AM  CBC  WBC 4.0 - 10.5 K/uL 5.4   14.3   Hemoglobin 12.0 - 15.0 g/dL 41.3  24.4  9.2 DELTA CHECK NOTED   Hematocrit 36.0 - 46.0 % 36.9   27.1   Platelets 150 - 400 K/uL 306   225        Latest Ref Rng & Units 12/30/2022    3:29 PM  CMP  Glucose 70 - 99 mg/dL 77   BUN 6 - 20 mg/dL 9   Creatinine 0.10 - 2.72 mg/dL 5.36   Sodium 644 - 034 mmol/L 133   Potassium 3.5 - 5.1 mmol/L 3.9   Chloride 98 - 111 mmol/L 102   CO2 22 - 32 mmol/L 24   Calcium 8.9 - 10.3 mg/dL 9.2      RADIOGRAPHIC STUDIES: I have personally reviewed the radiological images as listed and agreed with the findings in the report. CT SOFT TISSUE NECK W CONTRAST  Result Date: 12/26/2022 CLINICAL DATA:  Left-sided neck mass.  History of thyroid cancer. EXAM: CT NECK WITH CONTRAST TECHNIQUE: Multidetector CT imaging of the neck was performed using the standard protocol following the bolus administration of intravenous contrast. RADIATION DOSE REDUCTION: This exam was performed according to the departmental dose-optimization program which includes automated exposure control, adjustment of the mA and/or kV according to patient size and/or use of iterative reconstruction technique. CONTRAST:  75mL ISOVUE-300 IOPAMIDOL (ISOVUE-300) INJECTION 61% COMPARISON:  Neck ultrasound 12/24/2022 FINDINGS: Pharynx and larynx: No mass identified within limitations of dental streak artifact through the oral cavity and oropharynx. Patent airway. No fluid collection or inflammatory changes in the parapharyngeal or  retropharyngeal spaces. Salivary glands: No inflammation, mass, or stone. Thyroid: Status post thyroidectomy. Lymph nodes: Pathologic left level IIa lymph node measuring 4.0 x 2.3 x 1.4 cm with areas of internal necrosis or cystic change. Additional slightly prominent but small lymph nodes elsewhere in left levels II and III which are all subcentimeter in short axis. No other frankly enlarged lymph nodes in the neck on either side. Vascular: Major vascular structures of the neck are patent. Limited intracranial: Unremarkable. Visualized orbits: Unremarkable. Mastoids and visualized paranasal sinuses: Mild-to-moderate mucosal thickening in the right sphenoid sinus. Clear mastoid air cells. Skeleton: No suspicious osseous lesion.  Mild cervical spondylosis. Upper chest: Clear lung apices. Other: None. IMPRESSION: Pathologic left level IIa lymph node concerning for metastatic disease. Recommend tissue sampling as well as visual inspection of the pharynx to assess for a primary mucosal lesion. Electronically Signed   By: Sebastian Ache M.D.   On: 12/26/2022 17:03   US SOFT TISSUE HEAD & NECK (NON-THYROID)  Result Date: 12/25/2022 CLINICAL DATA:  Palpable left neck lump for 2 months. Prior total thyroidectomy in 2004. EXAM: ULTRASOUND OF HEAD/NECK SOFT TISSUES TECHNIQUE: Ultrasound examination of the head and neck soft tissues was performed in the area of clinical concern. COMPARISON:  None available FINDINGS: Targeted sonographic evaluation of the area of concern in the left superior neck demonstrates an ovoid hypoechoic structure measuring 4.2 x 3.6 x 1.6 cm, most consistent with a abnormally enlarged lymph node. IMPRESSION: 4.2 x 3.6 x 1.6 cm ovoid hypoechoic structure in the left submandibular region, corresponding to the palpable abnormality is most likely a abnormally enlarged lymph node, suspicious for metastatic disease. Further evaluation with contrast-enhanced soft tissue neck CT is recommended.  Electronically Signed   By: Acquanetta Belling M.D.   On: 12/25/2022  11:15    ASSESSMENT & PLAN JASKIRAT SULTANI is a 47 y.o. female presenting [facility] for consultation regarding left lateral neck swelling. We have reviewed etiologies including lymphoproliferative disorder, metastatic disease, infection and inflammation. Patient will proceed with laboratory workup today   #Left lateral neck swelling Labs collected today include CBC, CMP, ESR, CRP, LDH, flow cytometry, Hep B & C serologies, HIV serology. -PCP already ordered CT biopsy and it is scheduled for 01/13/23. I will follow for the biopsy results.  #Anemia -Known diagnosis. Patient is scheduled for hysterectomy on 01/05/23 because of menorrhagia and large fibroids. -She has been receiving iron infusions per chart review.  -Will check iron panel and ferritin today.  -Patient will RTC when work up is complete.  Patient expressed understanding of the recommended workup and is agreeable to move forward.   All questions were answered. The patient knows to call the clinic with any problems, questions or concerns.  Shared visit with Dr. Leonides Schanz.  Orders Placed This Encounter  Procedures   CBC with Differential (Cancer Center Only)    Standing Status:   Future    Number of Occurrences:   1    Standing Expiration Date:   01/02/2024   CMP (Cancer Center only)    Standing Status:   Future    Number of Occurrences:   1    Standing Expiration Date:   01/02/2024   Sedimentation rate    Standing Status:   Future    Number of Occurrences:   1    Standing Expiration Date:   01/02/2024   C-reactive protein    Standing Status:   Future    Number of Occurrences:   1    Standing Expiration Date:   01/02/2024   Lactate dehydrogenase (LDH)    Standing Status:   Future    Number of Occurrences:   1    Standing Expiration Date:   01/02/2024   Flow Cytometry, Peripheral Blood (Oncology)    Standing Status:   Future    Number of  Occurrences:   1    Standing Expiration Date:   01/02/2024   HIV antibody (with reflex)    Standing Status:   Future    Number of Occurrences:   1    Standing Expiration Date:   01/02/2024   Hepatitis B surface antigen    Standing Status:   Future    Number of Occurrences:   1    Standing Expiration Date:   01/02/2024   Hepatitis B core antibody, total    Standing Status:   Future    Number of Occurrences:   1    Standing Expiration Date:   01/02/2024   Hepatitis B surface antibody    Standing Status:   Future    Number of Occurrences:   1    Standing Expiration Date:   01/02/2024   Hepatitis C antibody    Standing Status:   Future    Number of Occurrences:   1    Standing Expiration Date:   01/02/2024   Iron and TIBC    Standing Status:   Future    Standing Expiration Date:   01/02/2024   Ferritin    Standing Status:   Future    Number of Occurrences:   1    Standing Expiration Date:   01/02/2024   Iron and Iron Binding Capacity ([facility] only)    Standing Status:   Future  Number of Occurrences:   1    Standing Expiration Date:   01/02/2024      I have spent a total of 60 minutes minutes of face-to-face and non-face-to-face time, preparing to see the patient, obtaining and/or reviewing separately obtained history, performing a medically appropriate examination, counseling and educating the patient, ordering medications/tests/procedures, referring and communicating with other health care professionals, documenting clinical information in the electronic health record, independently interpreting results and communicating results to the patient, and care coordination.   Namon Cirri PA-C Department of Hematology/Oncology [facility] Phone: [phone]  I have read the above note and personally examined the patient. I agree with the assessment and plan as noted above.  Briefly Ms. Verlia Kaney is a 47 year old female who presents for  evaluation of lymphadenopathy.  Patient has a prior history of treated thyroid cancer.  Her symptoms have been persistent for over 2 months time.  Initial imaging on 12/26/2022 with a CT scan that showed pathological left to a lymph node concerning for metastatic disease.  She is currently scheduled for a CT-guided biopsy on 01/13/2023.  At this time we will order baseline lymphadenopathy workup to include CBC, CMP, ESR, LDH, and CRP.  Will also order flow cytometry.  Pending the results of the CT-guided biopsy we will determine what further imaging is required if any.  Additionally if that biopsy is inconclusive may need to consider an excisional biopsy of the lymph node.  The patient voiced understanding of our findings and the plan moving forward.   Ulysees Barns, MD Department of Hematology/Oncology [facility] at [facility] Phone: [phone] Pager: [phone] Email: [email or web address]

## 2023-01-05 ENCOUNTER — Encounter (HOSPITAL_COMMUNITY): Payer: Self-pay | Admitting: Obstetrics and Gynecology

## 2023-01-05 ENCOUNTER — Inpatient Hospital Stay (HOSPITAL_COMMUNITY)
Admission: RE | Admit: 2023-01-05 | Discharge: 2023-01-07 | DRG: 743 | Disposition: A | Discharge status: Home / Self Care | Payer: 59 | Attending: Obstetrics and Gynecology | Admitting: Obstetrics and Gynecology

## 2023-01-05 ENCOUNTER — Inpatient Hospital Stay (HOSPITAL_COMMUNITY): Payer: 59 | Admitting: Anesthesiology

## 2023-01-05 ENCOUNTER — Other Ambulatory Visit: Payer: Self-pay

## 2023-01-05 ENCOUNTER — Encounter (HOSPITAL_COMMUNITY)
Admission: RE | Disposition: A | Discharge status: Home / Self Care | Payer: Self-pay | Source: Home / Self Care | Attending: Obstetrics and Gynecology

## 2023-01-05 ENCOUNTER — Inpatient Hospital Stay (HOSPITAL_COMMUNITY): Payer: 59 | Admitting: Medical

## 2023-01-05 DIAGNOSIS — D259 Leiomyoma of uterus, unspecified: Secondary | ICD-10-CM | POA: Diagnosis present

## 2023-01-05 DIAGNOSIS — D638 Anemia in other chronic diseases classified elsewhere: Secondary | ICD-10-CM | POA: Diagnosis present

## 2023-01-05 DIAGNOSIS — E039 Hypothyroidism, unspecified: Secondary | ICD-10-CM

## 2023-01-05 DIAGNOSIS — Z85850 Personal history of malignant neoplasm of thyroid: Secondary | ICD-10-CM | POA: Diagnosis not present

## 2023-01-05 DIAGNOSIS — Z79899 Other long term (current) drug therapy: Secondary | ICD-10-CM | POA: Diagnosis not present

## 2023-01-05 DIAGNOSIS — Z79890 Hormone replacement therapy (postmenopausal): Secondary | ICD-10-CM

## 2023-01-05 DIAGNOSIS — N736 Female pelvic peritoneal adhesions (postinfective): Secondary | ICD-10-CM | POA: Diagnosis present

## 2023-01-05 DIAGNOSIS — I10 Essential (primary) hypertension: Secondary | ICD-10-CM | POA: Diagnosis present

## 2023-01-05 DIAGNOSIS — Z9851 Tubal ligation status: Secondary | ICD-10-CM | POA: Diagnosis not present

## 2023-01-05 DIAGNOSIS — Z8349 Family history of other endocrine, nutritional and metabolic diseases: Secondary | ICD-10-CM | POA: Diagnosis not present

## 2023-01-05 DIAGNOSIS — Z8249 Family history of ischemic heart disease and other diseases of the circulatory system: Secondary | ICD-10-CM

## 2023-01-05 DIAGNOSIS — N920 Excessive and frequent menstruation with regular cycle: Secondary | ICD-10-CM | POA: Diagnosis present

## 2023-01-05 DIAGNOSIS — N946 Dysmenorrhea, unspecified: Secondary | ICD-10-CM | POA: Diagnosis present

## 2023-01-05 DIAGNOSIS — E890 Postprocedural hypothyroidism: Secondary | ICD-10-CM | POA: Diagnosis present

## 2023-01-05 DIAGNOSIS — Z90710 Acquired absence of both cervix and uterus: Secondary | ICD-10-CM | POA: Diagnosis present

## 2023-01-05 HISTORY — PX: LAPAROSCOPY: SHX197

## 2023-01-05 HISTORY — PX: ABDOMINAL HYSTERECTOMY: SHX81

## 2023-01-05 LAB — ABO/RH: ABO/RH(D): O POS

## 2023-01-05 LAB — POCT PREGNANCY, URINE: Preg Test, Ur: NEGATIVE

## 2023-01-05 SURGERY — HYSTERECTOMY, ABDOMINAL
Anesthesia: Regional | Site: Abdomen | Laterality: Bilateral

## 2023-01-05 MED ORDER — ROCURONIUM BROMIDE 10 MG/ML (PF) SYRINGE
PREFILLED_SYRINGE | INTRAVENOUS | Status: AC
  Filled 2023-01-05: qty 10

## 2023-01-05 MED ORDER — KETOROLAC TROMETHAMINE 30 MG/ML IJ SOLN
INTRAMUSCULAR | Status: DC | PRN
  Administered 2023-01-05: 30 mg via INTRAVENOUS

## 2023-01-05 MED ORDER — LIDOCAINE 2% (20 MG/ML) 5 ML SYRINGE
INTRAMUSCULAR | Status: DC | PRN
  Administered 2023-01-05: 60 mg via INTRAVENOUS

## 2023-01-05 MED ORDER — LIDOCAINE 2% (20 MG/ML) 5 ML SYRINGE
INTRAMUSCULAR | Status: AC
  Filled 2023-01-05: qty 5

## 2023-01-05 MED ORDER — HYDROMORPHONE HCL 1 MG/ML IJ SOLN
0.2500 mg | INTRAMUSCULAR | Status: DC | PRN
  Administered 2023-01-05 (×4): 0.5 mg via INTRAVENOUS

## 2023-01-05 MED ORDER — HYDROMORPHONE HCL 1 MG/ML IJ SOLN
INTRAMUSCULAR | Status: AC
  Filled 2023-01-05: qty 0.5

## 2023-01-05 MED ORDER — FENTANYL CITRATE (PF) 250 MCG/5ML IJ SOLN
INTRAMUSCULAR | Status: DC | PRN
  Administered 2023-01-05: 75 ug via INTRAVENOUS
  Administered 2023-01-05 (×2): 25 ug via INTRAVENOUS
  Administered 2023-01-05: 75 ug via INTRAVENOUS
  Administered 2023-01-05: 50 ug via INTRAVENOUS

## 2023-01-05 MED ORDER — SODIUM CHLORIDE 0.9 % IR SOLN
Status: DC | PRN
  Administered 2023-01-05: 1000 mL via INTRAVESICAL

## 2023-01-05 MED ORDER — BUPIVACAINE HCL (PF) 0.25 % IJ SOLN
INTRAMUSCULAR | Status: DC | PRN
  Administered 2023-01-05: 3 mL

## 2023-01-05 MED ORDER — DIPHENHYDRAMINE HCL 50 MG/ML IJ SOLN: 12.5000 mg | Freq: Four times a day (QID) | INTRAMUSCULAR | Status: DC | PRN

## 2023-01-05 MED ORDER — PROMETHAZINE HCL 25 MG/ML IJ SOLN: 6.2500 mg | INTRAMUSCULAR | Status: DC | PRN

## 2023-01-05 MED ORDER — HYDROMORPHONE HCL 1 MG/ML IJ SOLN
INTRAMUSCULAR | Status: DC | PRN
  Administered 2023-01-05 (×2): .5 mg via INTRAVENOUS

## 2023-01-05 MED ORDER — HYDROMORPHONE HCL 1 MG/ML IJ SOLN
INTRAMUSCULAR | Status: AC
  Filled 2023-01-05: qty 1

## 2023-01-05 MED ORDER — AMISULPRIDE (ANTIEMETIC) 5 MG/2ML IV SOLN: 10.0000 mg | Freq: Once | INTRAVENOUS | Status: DC | PRN

## 2023-01-05 MED ORDER — FLUORESCEIN SODIUM 10 % IV SOLN
INTRAVENOUS | Status: DC | PRN
  Administered 2023-01-05: 300 mg via INTRAVENOUS

## 2023-01-05 MED ORDER — LACTATED RINGERS IV SOLN: INTRAVENOUS | Status: DC

## 2023-01-05 MED ORDER — PROPOFOL 10 MG/ML IV BOLUS
INTRAVENOUS | Status: DC | PRN
  Administered 2023-01-05: 200 mg via INTRAVENOUS

## 2023-01-05 MED ORDER — ACETAMINOPHEN 10 MG/ML IV SOLN
INTRAVENOUS | Status: AC
  Filled 2023-01-05: qty 100

## 2023-01-05 MED ORDER — ONDANSETRON HCL 4 MG PO TABS: 4.0000 mg | ORAL_TABLET | Freq: Four times a day (QID) | ORAL | Status: DC | PRN

## 2023-01-05 MED ORDER — ONDANSETRON HCL 4 MG/2ML IJ SOLN: 4.0000 mg | Freq: Four times a day (QID) | INTRAMUSCULAR | Status: DC | PRN

## 2023-01-05 MED ORDER — DEXAMETHASONE SODIUM PHOSPHATE 10 MG/ML IJ SOLN
INTRAMUSCULAR | Status: AC
  Filled 2023-01-05: qty 1

## 2023-01-05 MED ORDER — CHLORHEXIDINE GLUCONATE 0.12 % MT SOLN
15.0000 mL | Freq: Once | OROMUCOSAL | Status: AC
  Administered 2023-01-05: 15 mL via OROMUCOSAL
  Filled 2023-01-05: qty 15

## 2023-01-05 MED ORDER — MEPERIDINE HCL 25 MG/ML IJ SOLN: 6.2500 mg | INTRAMUSCULAR | Status: DC | PRN

## 2023-01-05 MED ORDER — PROPOFOL 10 MG/ML IV BOLUS
INTRAVENOUS | Status: AC
  Filled 2023-01-05: qty 20

## 2023-01-05 MED ORDER — DIPHENHYDRAMINE HCL 12.5 MG/5ML PO ELIX: 12.5000 mg | ORAL_SOLUTION | Freq: Four times a day (QID) | ORAL | Status: DC | PRN

## 2023-01-05 MED ORDER — FLUORESCEIN SODIUM 10 % IV SOLN
INTRAVENOUS | Status: AC
  Filled 2023-01-05: qty 5

## 2023-01-05 MED ORDER — SIMETHICONE 80 MG PO CHEW
80.0000 mg | CHEWABLE_TABLET | Freq: Four times a day (QID) | ORAL | Status: DC | PRN
  Administered 2023-01-05: 80 mg via ORAL
  Filled 2023-01-05: qty 1

## 2023-01-05 MED ORDER — KETAMINE HCL 10 MG/ML IJ SOLN
INTRAMUSCULAR | Status: DC | PRN
  Administered 2023-01-05: 25 mg via INTRAVENOUS

## 2023-01-05 MED ORDER — FENTANYL CITRATE (PF) 250 MCG/5ML IJ SOLN
INTRAMUSCULAR | Status: AC
  Filled 2023-01-05: qty 5

## 2023-01-05 MED ORDER — 0.9 % SODIUM CHLORIDE (POUR BTL) OPTIME
TOPICAL | Status: DC | PRN
  Administered 2023-01-05 (×3): 1000 mL

## 2023-01-05 MED ORDER — LACTATED RINGERS IV SOLN: INTRAVENOUS | Status: DC | PRN

## 2023-01-05 MED ORDER — SODIUM CHLORIDE (PF) 0.9 % IJ SOLN
INTRAMUSCULAR | Status: AC
  Filled 2023-01-05: qty 100

## 2023-01-05 MED ORDER — BUPIVACAINE HCL (PF) 0.25 % IJ SOLN
INTRAMUSCULAR | Status: AC
  Filled 2023-01-05: qty 30

## 2023-01-05 MED ORDER — HYDROMORPHONE 1 MG/ML IV SOLN
INTRAVENOUS | Status: DC
  Administered 2023-01-05: 0.4 mg via INTRAVENOUS
  Administered 2023-01-05: 30 mg via INTRAVENOUS
  Administered 2023-01-06: 0.2 mg via INTRAVENOUS
  Filled 2023-01-05: qty 30

## 2023-01-05 MED ORDER — KETAMINE HCL 50 MG/5ML IJ SOSY
PREFILLED_SYRINGE | INTRAMUSCULAR | Status: AC
  Filled 2023-01-05: qty 5

## 2023-01-05 MED ORDER — MIDAZOLAM HCL 2 MG/2ML IJ SOLN
INTRAMUSCULAR | Status: AC
  Filled 2023-01-05: qty 2

## 2023-01-05 MED ORDER — SUGAMMADEX SODIUM 200 MG/2ML IV SOLN
INTRAVENOUS | Status: DC | PRN
  Administered 2023-01-05: 200 mg via INTRAVENOUS

## 2023-01-05 MED ORDER — ONDANSETRON HCL 4 MG/2ML IJ SOLN
INTRAMUSCULAR | Status: DC | PRN
  Administered 2023-01-05: 4 mg via INTRAVENOUS

## 2023-01-05 MED ORDER — DEXAMETHASONE SODIUM PHOSPHATE 10 MG/ML IJ SOLN
INTRAMUSCULAR | Status: DC | PRN
  Administered 2023-01-05: 10 mg via INTRAVENOUS

## 2023-01-05 MED ORDER — ACETAMINOPHEN 10 MG/ML IV SOLN
1000.0000 mg | Freq: Once | INTRAVENOUS | Status: DC | PRN
  Administered 2023-01-05: 1000 mg via INTRAVENOUS

## 2023-01-05 MED ORDER — ORAL CARE MOUTH RINSE: 15.0000 mL | Freq: Once | OROMUCOSAL | Status: AC

## 2023-01-05 MED ORDER — POVIDONE-IODINE 10 % EX SWAB
2.0000 | Freq: Once | CUTANEOUS | Status: AC
  Administered 2023-01-05: 2 via TOPICAL

## 2023-01-05 MED ORDER — NALOXONE HCL 0.4 MG/ML IJ SOLN: 0.4000 mg | INTRAMUSCULAR | Status: DC | PRN

## 2023-01-05 MED ORDER — BUPIVACAINE-EPINEPHRINE (PF) 0.25% -1:200000 IJ SOLN
INTRAMUSCULAR | Status: DC | PRN
  Administered 2023-01-05 (×2): 30 mL

## 2023-01-05 MED ORDER — ACETAMINOPHEN 325 MG PO TABS: 325.0000 mg | ORAL_TABLET | Freq: Once | ORAL | Status: DC | PRN

## 2023-01-05 MED ORDER — ONDANSETRON HCL 4 MG/2ML IJ SOLN
INTRAMUSCULAR | Status: AC
  Filled 2023-01-05: qty 2

## 2023-01-05 MED ORDER — MIDAZOLAM HCL 2 MG/2ML IJ SOLN
INTRAMUSCULAR | Status: DC | PRN
  Administered 2023-01-05: 2 mg via INTRAVENOUS

## 2023-01-05 MED ORDER — CEFAZOLIN SODIUM-DEXTROSE 2-4 GM/100ML-% IV SOLN
2.0000 g | Freq: Once | INTRAVENOUS | Status: AC
  Administered 2023-01-05: 2 g via INTRAVENOUS
  Filled 2023-01-05: qty 100

## 2023-01-05 MED ORDER — ROCURONIUM BROMIDE 10 MG/ML (PF) SYRINGE
PREFILLED_SYRINGE | INTRAVENOUS | Status: DC | PRN
  Administered 2023-01-05 (×2): 10 mg via INTRAVENOUS
  Administered 2023-01-05: 70 mg via INTRAVENOUS
  Administered 2023-01-05: 10 mg via INTRAVENOUS
  Administered 2023-01-05: 20 mg via INTRAVENOUS
  Administered 2023-01-05: 10 mg via INTRAVENOUS

## 2023-01-05 MED ORDER — ACETAMINOPHEN 160 MG/5ML PO SOLN: 325.0000 mg | Freq: Once | ORAL | Status: DC | PRN

## 2023-01-05 MED ORDER — SODIUM CHLORIDE 0.9% FLUSH: 9.0000 mL | INTRAVENOUS | Status: DC | PRN

## 2023-01-05 SURGICAL SUPPLY — 73 items
ADH SKN CLS APL DERMABOND .7 (GAUZE/BANDAGES/DRESSINGS) ×4
APL PRP STRL LF DISP 70% ISPRP (MISCELLANEOUS) ×4
APL SKNCLS STERI-STRIP NONHPOA (GAUZE/BANDAGES/DRESSINGS) ×4
BENZOIN TINCTURE PRP APPL 2/3 (GAUZE/BANDAGES/DRESSINGS) ×2 IMPLANT
CANISTER SUCT 3000ML PPV (MISCELLANEOUS) ×2 IMPLANT
CATH FOLEY 3WAY 5CC 16FR (CATHETERS) IMPLANT
CHLORAPREP W/TINT 26 (MISCELLANEOUS) ×2 IMPLANT
COVER BACK TABLE 60X90IN (DRAPES) ×4 IMPLANT
COVER MAYO STAND STRL (DRAPES) ×4 IMPLANT
DERMABOND ADVANCED .7 DNX12 (GAUZE/BANDAGES/DRESSINGS) ×4 IMPLANT
DRAPE WARM FLUID 44X44 (DRAPES) IMPLANT
DRSG OPSITE POSTOP 4X10 (GAUZE/BANDAGES/DRESSINGS) ×6 IMPLANT
ELECT REM PT RETURN 9FT ADLT (ELECTROSURGICAL) ×4
ELECTRODE REM PT RTRN 9FT ADLT (ELECTROSURGICAL) ×4 IMPLANT
GAUZE 4X4 16PLY ~~LOC~~+RFID DBL (SPONGE) ×2 IMPLANT
GAUZE SPONGE 4X4 16PLY XRAY LF (GAUZE/BANDAGES/DRESSINGS) ×4 IMPLANT
GAUZE VASELINE 3X9 (GAUZE/BANDAGES/DRESSINGS) IMPLANT
GLOVE BIO SURGEON STRL SZ 6.5 (GLOVE) ×4 IMPLANT
GLOVE BIO SURGEON STRL SZ7 (GLOVE) ×8 IMPLANT
GLOVE BIOGEL PI IND STRL 7.0 (GLOVE) ×16 IMPLANT
GLOVE SURG UNDER POLY LF SZ7 (GLOVE) ×16 IMPLANT
GOWN STRL REUS W/ TWL LRG LVL3 (GOWN DISPOSABLE) ×8 IMPLANT
GOWN STRL REUS W/TWL LRG LVL3 (GOWN DISPOSABLE) ×8
HIBICLENS CHG 4% 4OZ BTL (MISCELLANEOUS) ×6 IMPLANT
KIT PINK PAD W/HEAD ARE REST (MISCELLANEOUS) ×4
KIT PINK PAD W/HEAD ARM REST (MISCELLANEOUS) ×4 IMPLANT
KIT TURNOVER KIT B (KITS) ×4 IMPLANT
MANIFOLD NEPTUNE II (INSTRUMENTS) ×2 IMPLANT
NDL MAYO CATGUT SZ4 TPR NDL (NEEDLE) ×2 IMPLANT
NEEDLE MAYO CATGUT SZ4 (NEEDLE) ×4 IMPLANT
NS IRRIG 1000ML POUR BTL (IV SOLUTION) ×8 IMPLANT
PACK LAVH (CUSTOM PROCEDURE TRAY) ×4 IMPLANT
PAD ARMBOARD 7.5X6 YLW CONV (MISCELLANEOUS) ×4 IMPLANT
PAD OB MATERNITY 4.3X12.25 (PERSONAL CARE ITEMS) ×4 IMPLANT
POUCH LAPAROSCOPIC INSTRUMENT (MISCELLANEOUS) ×4 IMPLANT
RTRCTR C-SECT PINK 25CM LRG (MISCELLANEOUS) ×2 IMPLANT
SET CYSTO W/LG BORE CLAMP LF (SET/KITS/TRAYS/PACK) ×4 IMPLANT
SET TUBE SMOKE EVAC HIGH FLOW (TUBING) ×4 IMPLANT
SHEET LAVH (DRAPES) ×4 IMPLANT
SLEEVE Z-THREAD 5X100MM (TROCAR) ×4 IMPLANT
SOL ELECTROSURG ANTI STICK (MISCELLANEOUS) ×4
SOLUTION ELECTROSURG ANTI STCK (MISCELLANEOUS) ×2 IMPLANT
SPECIMEN JAR MEDIUM (MISCELLANEOUS) ×2 IMPLANT
SPIKE FLUID TRANSFER (MISCELLANEOUS) ×8 IMPLANT
SPONGE INTESTINAL PEANUT (DISPOSABLE) IMPLANT
SPONGE SURGIFOAM ABS GEL 12-7 (HEMOSTASIS) IMPLANT
SPONGE T-LAP 18X18 ~~LOC~~+RFID (SPONGE) ×12 IMPLANT
SPONGE T-LAP 4X18 ~~LOC~~+RFID (SPONGE) ×4 IMPLANT
STAPLER VISISTAT 35W (STAPLE) IMPLANT
STRIP CLOSURE SKIN 1/2X4 (GAUZE/BANDAGES/DRESSINGS) ×4 IMPLANT
SUT CHROMIC 0 CT 1 (SUTURE) ×2 IMPLANT
SUT CHROMIC 0 UR 5 27 (SUTURE) ×4 IMPLANT
SUT CHROMIC 2 0 CT 1 (SUTURE) ×4 IMPLANT
SUT MNCRL AB 3-0 PS2 27 (SUTURE) ×8 IMPLANT
SUT MON AB 3-0 SH 27 (SUTURE)
SUT MON AB 3-0 SH27 (SUTURE) ×2 IMPLANT
SUT PDS AB 0 CT1 27 (SUTURE) IMPLANT
SUT PLAIN 2 0 XLH (SUTURE) ×4 IMPLANT
SUT VIC AB 0 CT1 18XCR BRD8 (SUTURE) ×12 IMPLANT
SUT VIC AB 0 CT1 27 (SUTURE) ×8
SUT VIC AB 0 CT1 27XBRD ANBCTR (SUTURE) ×12 IMPLANT
SUT VIC AB 0 CT1 36 (SUTURE) ×2 IMPLANT
SUT VIC AB 0 CT1 8-18 (SUTURE) ×12
SUT VIC AB 2-0 SH 27 (SUTURE) ×4
SUT VIC AB 2-0 SH 27XBRD (SUTURE) ×4 IMPLANT
SUT VICRYL 0 TIES 12 18 (SUTURE) ×4 IMPLANT
SUT VICRYL 0 UR6 27IN ABS (SUTURE) ×4 IMPLANT
SYR 50ML LL SCALE MARK (SYRINGE) ×4 IMPLANT
SYR BULB IRRIG 60ML STRL (SYRINGE) ×4 IMPLANT
SYR CONTROL 10ML LL (SYRINGE) ×2 IMPLANT
TOWEL GREEN STERILE FF (TOWEL DISPOSABLE) ×8 IMPLANT
TRAY FOLEY W/BAG SLVR 14FR (SET/KITS/TRAYS/PACK) ×4 IMPLANT
TROCAR BALLN 12MMX100 BLUNT (TROCAR) ×4 IMPLANT

## 2023-01-05 NOTE — H&P (Signed)
Breanna Lucero is an 47 y.o. female. Pt presenting for hysterectomy secondary to menorrhagia.  Her menses lasts seven days and she soaks through her clothes.  She has also had to have iron infusions.  No SOB or CP.    Pertinent Gynecological History: Menses: regular every month with spotting approximately 7-10 days per month  Contraception: tubal ligation  Blood transfusions:  none but has received iron infusions Sexually transmitted diseases: no past history Previous GYN Procedures:  CS times two   Last mammogram: normal Date: 09/2022 Last pap: normal Date: 09/2022 OB History: G3, P3   Menstrual History: Menarche age: 10 Patient's last menstrual period was 12/04/2022 (exact date).    Past Medical History:  Diagnosis Date   Anemia    menorrhagia induced   ASCUS (atypical squamous cells of undetermined significance) on Pap smear    neg high risk hpv   Cancer (HCC) 09/2002   Thyroid   Goiter    Heart murmur    as a child   Hypertension    Hypothyroid    Hypothyroid     Past Surgical History:  Procedure Laterality Date   CESAREAN SECTION     x 2   COLPOSCOPY     THYROID SURGERY  2004    Family History  Problem Relation Age of Onset   Heart disease Father    Thyroid disease Father     Social History:  reports that she has never smoked. She has never used smokeless tobacco. She reports that she does not drink alcohol and does not use drugs.  Allergies: No Known Allergies  Medications Prior to Admission  Medication Sig Dispense Refill Last Dose   ferrous sulfate 325 (65 FE) MG tablet Take 325 mg by mouth 3 (three) times daily with meals.   Past Week   fluticasone (FLONASE) 50 MCG/ACT nasal spray Place 2 sprays into both nostrils daily. (Patient taking differently: Place 2 sprays into both nostrils daily as needed for allergies.) 16 g 0 01/03/2023   Garlic 100 MG TABS Take 100 mg by mouth 2 (two) times daily.   Past Month   ibuprofen (ADVIL) 200 MG tablet Take 400-800  mg by mouth every 6 (six) hours as needed for mild pain or moderate pain.   Past Week   levothyroxine (SYNTHROID) 125 MCG tablet Take 1 tablet (125 mcg total) by mouth daily. (Patient taking differently: Take 150 mcg by mouth daily. "Must take the Brand") 90 tablet 3 01/05/2023 at 0630   progesterone (PROMETRIUM) 200 MG capsule Take 200 mg by mouth at bedtime.   01/03/2023   valsartan-hydrochlorothiazide (DIOVAN-HCT) 320-12.5 MG tablet Take 1 tablet by mouth daily.   01/04/2023 at 1000   Vitamin D, Ergocalciferol, (DRISDOL) 1.25 MG (50000 UNIT) CAPS capsule Take 50,000 Units by mouth once a week.   12/31/2022    ROS  Blood pressure 135/79, pulse 90, temperature 98 F (36.7 C), resp. rate 17, height 5' 3.5" (1.613 m), weight 98.9 kg, last menstrual period 12/04/2022, SpO2 98 %. Physical Exam Physical Examination: General appearance - alert, well appearing, and in no distress Chest - clear to auscultation, no wheezes, rales or rhonchi, symmetric air entry Heart - normal rate and regular rhythm Abdomen - soft, nontender, nondistended, no masses or organomegaly Pelvic - normal external genitalia, vulva, vagina, cervix, uterus and adnexa, UTERUS: uterus is normal size, shape, consistency and nontender, enlarged to 16 week's size Extremities - peripheral pulses normal, no pedal edema, no clubbing or cyanosis, Homan's  sign negative bilaterally   Results for orders placed or performed during the hospital encounter of 01/05/23 (from the past 24 hour(s))  ABO/Rh     Status: None (Preliminary result)   Collection Time: 01/05/23  8:42 AM  Result Value Ref Range   ABO/RH(D) PENDING   Pregnancy, urine POC     Status: None   Collection Time: 01/05/23  8:51 AM  Result Value Ref Range   Preg Test, Ur NEGATIVE NEGATIVE    No results found.  Assessment/Plan: Sx fibroids causing menorrhagia.  Plan LAVH possible TAH with possible ovarian cystectomy.    Pt understands the risks to be but not limited to  bleeding, infection,problems with anesthesia, nerve damage from position during surgery and  damage to internal organs such as bowel, bladder and other major organs.  All other options have been reviewed with the patient.  They are but not limited to observation, hormonal management, D&C with and without ablation, [place], lupron and removal of just the diseased tissue.  Pt also signed a consent in the office Tatiana Courter A Thedora Rings 01/05/2023, 9:21 AM

## 2023-01-05 NOTE — Op Note (Signed)
Indications: symptomatic Fibroids Menorrhagia dysmenorrhea  Pre-operative Diagnosis: see above  Post-operative Diagnosis: same  Operation:  Diagnostic laparoscopy.  Total abdominal hysterectomy.  Bilateral salpinectomy.  LOA  Surgeon: ZOXWRUE,AVWUJ A   Assistants: EMA Sallye Ober MD  Anesthesia: General endotracheal anesthesia  ASA Class: per anesthesia  IVF 1800 cc  EBL 300 cc  UO 300 cc clear urine.    Procedure Details  The patient was seen in the Holding Room. The risks, benefits, complications, treatment options, and expected outcomes were discussed with the patient.  The patient concurred with the proposed plan, giving informed consent.  The site of surgery properly noted/marked. The patient was taken to Operating Room # 8 identified as [place] and the procedure verified as a  LAVH, poss  Total abdominal hysterectomy and cystoscopy.   A Time Out was held and the above information confirmed.  After induction of anesthesia, the patient was draped and prepped in the usual sterile manner. Pt was placed in supine position after anesthesia and draped and prepped in the usual sterile manner. Foley catheter was placed. Attention was then turned to the vagina.  The weighted retractor was placed in the vagina.  The anterior lip of the cervix was grasped with a single tooth tenaculum.  The hulka manipulator was placed   A umbilical incision was made and carried through the subcutaneous tissue to the fascia.the peritoneum was opened.  The hasson was placed.  The abdomen was insufflated with co2 gas.  The camera was placed in the abdomen.  The uterus was 16 week size and irregular.Liver appeared normal.   There were multiple uterine  adhesions posteriorly  from the omentum.  The anterior aspect of the uterus was adherant to the bladder.   Because of the size of the uterus and adhesions.  I decided to do an abdominal hysterectomy.  The umbilical fascial incision was closed. A pfannensteil  incision was made with the knife on the skin 2 cm above the symphysis pubis along her previous incision.   Fascial incision was made and extended bilaterally. The rectus muscles were separated. The peritoneum was identified and entered. Peritoneal incision was extended longitudinally.  The above findings were noted. An Network engineer w was placed and bowel was packed away from the surgical site.   The omental uterine adhesions were lysed sharply and ligated with suture.  This took about an hour.    The round ligaments were identified, cut, and ligated with 0-Vicryl. The anterior peritoneal reflection was incised and the bladder was dissected off the lower uterine segment. This was done slowly and both sharply and bluntly because of adhesions from her cesareans .  Marland Kitchen The right utero-ovarian ligament and proximal fallopian tube were grasped, cut, and suture ligated with 0-Vicryl. The left utero-ovarian ligament and proximal fallopian tube were grasped, cut and suture ligated with 0-Vicryl. Hemostasis was observed. The fallopian tubes were clamped with kelly clamps and removed using metzenbaum scissors.  The mesosalpinx was made hemostatic with 0 vicryl suture.   The uterine vessels were skeletonized, then clamped, cut and suture ligated with 0-Vicryl suture. The fundus was removed using the knife to obtain better visualization.   Serial pedicles of the cardinal and utero-sacral ligaments were clamped, cut, and suture ligated with 0-Vicryl. Entrance was made into the vagina and the uterus removed. Vaginal cuff angle sutures were placed incorporating the utero-sacral ligaments for support. The vaginal cuff was then closed with a running stitch of 0- Vicryl. Lavage was carried out  until clear. Hemostasis was observed.   Retractor and all packing was removed from the abdomen. The peritoneum was closed with 0 chromic.   The fascia was approximated with running sutures of 0-Vicryl. Lavage was again carried out.   Hemostasis was observed. The subcutaneous tissue was reapproximated with o plain in interrupted suture.   The umbilical and pfannesteil skin incisions  Were approximated 4-0 monocryl with subcuticular stitches.  The 5mm incision was closed with dermabond. All incisions reinforced with dermabond.    Instrument, sponge, and needle counts were correct prior to abdominal closure and at the conclusion of the case.   Findings: 16 week size fibroid uterus.  Irregular and bulky.  Right tubal cyst.  Uterus >400 grams.            Specimens: uterus , cervix and bilateral tubes         Implants: none         Complications:  None; patient tolerated the procedure well.         Disposition: PACU - hemodynamically stable.         Condition: stable  Attending Attestation: I was present and scrubbed for the entire procedure. The procedure was comp[lex and I needed an assistant to complete the case

## 2023-01-05 NOTE — Plan of Care (Signed)

## 2023-01-05 NOTE — Anesthesia Postprocedure Evaluation (Signed)
Anesthesia Post Note  Patient: JEENA MANU  Procedure(s) Performed: ABDOMINAL HYSTERECTOMY, BILATERAL SALPINGECTOMY (Bilateral: Abdomen) LAPAROSCOPY DIAGNOSTIC (Abdomen)     Patient location during evaluation: PACU Anesthesia Type: Regional and General Level of consciousness: awake and alert Pain management: pain level controlled Vital Signs Assessment: post-procedure vital signs reviewed and stable Respiratory status: spontaneous breathing, nonlabored ventilation, respiratory function stable and patient connected to nasal cannula oxygen Cardiovascular status: blood pressure returned to baseline and stable Postop Assessment: no apparent nausea or vomiting Anesthetic complications: no  No notable events documented.  Last Vitals:  Vitals:   01/05/23 1500 01/05/23 1515  BP: 134/72 128/82  Pulse: (!) 101 99  Resp: 17 16  Temp: 36.7 C 36.9 C  SpO2: 94% 99%    Last Pain:  Vitals:   01/05/23 1515  TempSrc: Oral  PainSc:                  Shelton Silvas

## 2023-01-05 NOTE — Transfer of Care (Cosign Needed)
Immediate Anesthesia Transfer of Care Note  Patient: Breanna Lucero  Procedure(s) Performed: ABDOMINAL HYSTERECTOMY, BILATERAL SALPINGECTOMY (Bilateral: Abdomen) LAPAROSCOPY DIAGNOSTIC (Abdomen)  Patient Location: PACU  Anesthesia Type:GA combined with regional for post-op pain  Level of Consciousness: drowsy and patient cooperative  Airway & Oxygen Therapy: Patient Spontanous Breathing and Patient connected to face mask oxygen  Post-op Assessment: Report given to RN and Post -op Vital signs reviewed and stable  Post vital signs: Reviewed and stable  Last Vitals:  Vitals Value Taken Time  BP 152/74 01/05/23 1321  Temp 36.7 C 01/05/23 1321  Pulse 95 01/05/23 1324  Resp 17 01/05/23 1324  SpO2 100 % 01/05/23 1324  Vitals shown include unvalidated device data.  Last Pain:  Vitals:   01/05/23 0840  PainSc: 0-No pain      Patients Stated Pain Goal: 0 (01/05/23 0840)  Complications: No notable events documented.

## 2023-01-05 NOTE — Anesthesia Procedure Notes (Signed)
Anesthesia Regional Block: TAP block   Pre-Anesthetic Checklist: , timeout performed,  Correct Patient, Correct Site, Correct Laterality,  Correct Procedure, Correct Position, site marked,  Risks and benefits discussed,  Surgical consent,  Pre-op evaluation,  At surgeon's request and post-op pain management  Laterality: Right  Prep: chloraprep       Needles:  Injection technique: Single-shot  Needle Type: Echogenic Stimulator Needle     Needle Length: 9cm  Needle Gauge: 21     Additional Needles:   Procedures:,,,, ultrasound used (permanent image in chart),,    Narrative:  Start time: 01/05/2023 1:00 PM End time: 01/05/2023 1:05 PM Injection made incrementally with aspirations every 5 mL.  Performed by: Personally  Anesthesiologist: Shelton Silvas, MD  Additional Notes: Discussed risks and benefits of the nerve block in detail, including but not limited vascular injury, permanent nerve damage and infection.   Patient tolerated the procedure well. Local anesthetic introduced in an incremental fashion under minimal resistance after negative aspirations. No paresthesias were elicited. After completion of the procedure, no acute issues were identified and patient continued to be monitored by RN.

## 2023-01-05 NOTE — Anesthesia Procedure Notes (Signed)
Anesthesia Regional Block: TAP block   Pre-Anesthetic Checklist: , timeout performed,  Correct Patient, Correct Site, Correct Laterality,  Correct Procedure, Correct Position, site marked,  Risks and benefits discussed,  Surgical consent,  Pre-op evaluation,  At surgeon's request and post-op pain management  Laterality: Left  Prep: chloraprep       Needles:  Injection technique: Single-shot  Needle Type: Echogenic Stimulator Needle     Needle Length: 9cm  Needle Gauge: 21     Additional Needles:   Procedures:,,,, ultrasound used (permanent image in chart),,    Narrative:  Start time: 01/05/2023 12:55 PM End time: 01/05/2023 1:00 PM Injection made incrementally with aspirations every 5 mL.  Performed by: Personally  Anesthesiologist: Shelton Silvas, MD  Additional Notes: Discussed risks and benefits of the nerve block in detail, including but not limited vascular injury, permanent nerve damage and infection.   Patient tolerated the procedure well. Local anesthetic introduced in an incremental fashion under minimal resistance after negative aspirations. No paresthesias were elicited. After completion of the procedure, no acute issues were identified and patient continued to be monitored by RN.

## 2023-01-05 NOTE — Anesthesia Procedure Notes (Cosign Needed)
Procedure Name: Intubation Date/Time: 01/05/2023 9:35 AM  Performed by: Shelton Silvas, MDPre-anesthesia Checklist: Patient identified, Emergency Drugs available, Suction available and Patient being monitored Patient Re-evaluated:Patient Re-evaluated prior to induction Oxygen Delivery Method: Circle system utilized Preoxygenation: Pre-oxygenation with 100% oxygen Induction Type: IV induction Ventilation: Mask ventilation without difficulty Laryngoscope Size: Mac and 3 Grade View: Grade I Tube type: Oral Tube size: 7.0 mm Number of attempts: 1 Airway Equipment and Method: Stylet Placement Confirmation: ETT inserted through vocal cords under direct vision, positive ETCO2 and breath sounds checked- equal and bilateral Secured at: 21 cm Tube secured with: Tape Dental Injury: Teeth and Oropharynx as per pre-operative assessment

## 2023-01-06 ENCOUNTER — Encounter (HOSPITAL_COMMUNITY): Payer: Self-pay | Admitting: Obstetrics and Gynecology

## 2023-01-06 LAB — BASIC METABOLIC PANEL
Anion gap: 9 (ref 5–15)
BUN: 9 mg/dL (ref 6–20)
CO2: 23 mmol/L (ref 22–32)
Calcium: 8.5 mg/dL — ABNORMAL LOW (ref 8.9–10.3)
Chloride: 105 mmol/L (ref 98–111)
Creatinine, Ser: 0.92 mg/dL (ref 0.44–1.00)
GFR, Estimated: >60 mL/min (ref >60)
Glucose, Bld: 134 mg/dL — ABNORMAL HIGH (ref 70–99)
Potassium: 3.9 mmol/L (ref 3.5–5.1)
Sodium: 137 mmol/L (ref 135–145)

## 2023-01-06 LAB — CBC
HCT: 25.2 % — ABNORMAL LOW (ref 36.0–46.0)
Hemoglobin: 8.4 g/dL — ABNORMAL LOW (ref 12.0–15.0)
MCH: 27.5 pg (ref 26.0–34.0)
MCHC: 33.3 g/dL (ref 30.0–36.0)
MCV: 82.4 fL (ref 80.0–100.0)
Platelets: 322 10*3/uL (ref 150–400)
RBC: 3.06 MIL/uL — ABNORMAL LOW (ref 3.87–5.11)
RDW: 16.2 % — ABNORMAL HIGH (ref 11.5–15.5)
WBC: 11.8 10*3/uL — ABNORMAL HIGH (ref 4.0–10.5)
nRBC: 0 % (ref 0.0–0.2)

## 2023-01-06 LAB — SURGICAL PATHOLOGY

## 2023-01-06 MED ORDER — IRON SUCROSE 500 MG IVPB - SIMPLE MED
500.0000 mg | INTRAVENOUS | Status: DC
  Filled 2023-01-06 (×2): qty 275

## 2023-01-06 MED ORDER — ACETAMINOPHEN 325 MG PO TABS: 650.0000 mg | ORAL_TABLET | Freq: Four times a day (QID) | ORAL | Status: DC | PRN

## 2023-01-06 MED ORDER — POLYETHYLENE GLYCOL 3350 17 G PO PACK
17.0000 g | PACK | Freq: Two times a day (BID) | ORAL | Status: DC
  Administered 2023-01-06 (×2): 17 g via ORAL
  Filled 2023-01-06 (×2): qty 1

## 2023-01-06 MED ORDER — IBUPROFEN 600 MG PO TABS
600.0000 mg | ORAL_TABLET | Freq: Four times a day (QID) | ORAL | Status: DC | PRN
  Filled 2023-01-06: qty 1

## 2023-01-06 MED ORDER — OXYCODONE HCL 5 MG PO TABS
10.0000 mg | ORAL_TABLET | ORAL | Status: DC | PRN
  Administered 2023-01-07: 10 mg via ORAL
  Filled 2023-01-06: qty 2

## 2023-01-06 MED ORDER — IRON SUCROSE 500 MG IVPB - SIMPLE MED: 500.0000 mg | INTRAVENOUS | Status: DC

## 2023-01-06 MED ORDER — SODIUM CHLORIDE 0.9 % IV SOLN
500.0000 mg | INTRAVENOUS | Status: DC
  Administered 2023-01-06: 500 mg via INTRAVENOUS
  Filled 2023-01-06 (×2): qty 25

## 2023-01-06 MED ORDER — METOCLOPRAMIDE HCL 5 MG PO TABS
10.0000 mg | ORAL_TABLET | Freq: Three times a day (TID) | ORAL | Status: DC
  Administered 2023-01-06 (×3): 10 mg via ORAL
  Filled 2023-01-06 (×3): qty 2

## 2023-01-06 NOTE — TOC CM/SW Note (Signed)
  Transition of Care [facility]) Screening Note   Patient Details  Name: Breanna Lucero Date of Birth: February 05, 1976   Transition of Care [facility]) CM/SW Contact:    Kingsley Plan, RN Phone Number: 01/06/2023, 2:46 PM    Transition of Care Department [facility]) has reviewed patient and no TOC needs have been identified at this time. We will continue to monitor patient advancement through interdisciplinary progression rounds. If new patient transition needs arise, please place a TOC consult.

## 2023-01-06 NOTE — Progress Notes (Signed)
1 Day Post-Op Procedure(s) (LRB): ABDOMINAL HYSTERECTOMY, BILATERAL SALPINGECTOMY (Bilateral) LAPAROSCOPY DIAGNOSTIC  Subjective: Patient reports incisional pain, tolerating PO, and no problems voiding.   BP 136/69 (BP Location: Right Arm)   Pulse 85   Temp 99.2 F (37.3 C) (Oral)   Resp 17   Ht 5' 3.5" (1.613 m)   Wt 98.9 kg   LMP 12/04/2022 (Exact Date) Comment: Urine pregnancy test negative on 01/05/23  SpO2 97%   BMI 38.01 kg/m    Objective: I have reviewed patient's vital signs, intake and output, medications, and labs.  General: alert and cooperative Resp: clear to auscultation bilaterally Cardio: regular rate and rhythm GI: soft, non-tender; bowel sounds normal; no masses,  no organomegaly and incision: clean, dry, and intact Extremities: Homans sign is negative, no sign of DVT Vaginal Bleeding: minimal  Assessment: s/p Procedure(s): ABDOMINAL HYSTERECTOMY, BILATERAL SALPINGECTOMY (Bilateral) LAPAROSCOPY DIAGNOSTIC: stable, tolerating diet, and anemia  Plan: Advance diet Encourage ambulation Advance to PO medication Discontinue IV fluids Iv iron for anemia Monitor temp curve   LOS: 1 day    Michael Litter, MD 01/06/2023, 11:39 PM

## 2023-01-06 NOTE — Plan of Care (Signed)

## 2023-01-07 LAB — SURGICAL PATHOLOGY

## 2023-01-07 LAB — FLOW CYTOMETRY

## 2023-01-07 MED ORDER — ACETAMINOPHEN 325 MG PO TABS: 650.0000 mg | ORAL_TABLET | Freq: Four times a day (QID) | ORAL | 0 refills | Status: DC | PRN

## 2023-01-07 MED ORDER — METOCLOPRAMIDE HCL 10 MG PO TABS: 10.0000 mg | ORAL_TABLET | Freq: Three times a day (TID) | ORAL | 0 refills | Status: DC

## 2023-01-07 MED ORDER — IBUPROFEN 600 MG PO TABS: 600.0000 mg | ORAL_TABLET | Freq: Four times a day (QID) | ORAL | 0 refills | Status: AC | PRN

## 2023-01-07 MED ORDER — ONDANSETRON HCL 4 MG PO TABS: 4.0000 mg | ORAL_TABLET | Freq: Four times a day (QID) | ORAL | 0 refills | Status: DC | PRN

## 2023-01-07 MED ORDER — GLYCERIN (ADULT) 2 G RE SUPP: 1.0000 | RECTAL | 0 refills | Status: DC | PRN

## 2023-01-07 MED ORDER — POLYETHYLENE GLYCOL 3350 17 G PO PACK: 17.0000 g | PACK | Freq: Two times a day (BID) | ORAL | 0 refills | Status: DC

## 2023-01-07 MED ORDER — OXYCODONE HCL 10 MG PO TABS: 10.0000 mg | ORAL_TABLET | ORAL | 0 refills | Status: DC | PRN

## 2023-01-07 NOTE — TOC Transition Note (Signed)
Transition of Care [facility]) - CM/SW Discharge Note   Patient Details  Name: MAJESTY THIND MRN: [id number] Date of Birth: 1976-04-26  Transition of Care [facility]) CM/SW Contact:  Tom-Johnson, Hershal Coria, RN Phone Number: 01/07/2023, 9:08 AM   Clinical Narrative:     Patient is scheduled for discharge today.  Readmission Risk Assessment done. Outpatient referral, hospital f/u and discharge instructions on AVS. No TOC needs or recommendations noted. Husband, Maximino Sarin to transport at discharge.  No further TOC needs noted.       Final next level of care: Home/Self Care Barriers to Discharge: Barriers Resolved   Patient Goals and CMS Choice CMS Medicare.gov Compare Post Acute Care list provided to:: Patient Choice offered to / list presented to : NA  Discharge Placement                  Patient to be transferred to facility by: Husband Name of family member notified: [facility]    Discharge Plan and Services Additional resources added to the After Visit Summary for                  DME Arranged: N/A DME Agency: NA       HH Arranged: NA HH Agency: NA        Social Determinants of Health (SDOH) Interventions SDOH Screenings   Tobacco Use: Low Risk  (01/06/2023)     Readmission Risk Interventions    01/07/2023    9:07 AM  Readmission Risk Prevention Plan  Post Dischage Appt Complete  Medication Screening Complete  Transportation Screening Complete

## 2023-01-07 NOTE — Discharge Summary (Signed)
Physician Discharge Summary  Patient ID: Breanna Lucero MRN: [id number] DOB/AGE: 1975/10/01 47 y.o.  Admit date: 01/05/2023 Discharge date: 01/07/2023  Admission Diagnoses:  Discharge Diagnoses:  Principal Problem:   Excessive and frequent menstruation with regular cycle Active Problems:   S/P TAH (total abdominal hysterectomy)   Discharged Condition: good  Hospital Course: Pt came in for LAVH and due to size of the uterus and adhesions had a TAH.  She was given IV iron and has done well and is ready for discharge  Consults: None  Significant Diagnostic Studies: labs: sig for anemia  Treatments: IV hydration and surgery:   Discharge Exam: Blood pressure 138/73, pulse 83, temperature 98.6 F (37 C), temperature source Oral, resp. rate 20, height 5' 3.5" (1.613 m), weight 98.9 kg, last menstrual period 12/04/2022, SpO2 98 %. General appearance: alert and cooperative Resp: clear to auscultation bilaterally Cardio: regular rate and rhythm Pelvic: scant VB Extremities: Homans sign is negative, no sign of DVT Incision/Wound: CDI  Disposition: Discharge disposition: 01-Home or Self Care         Follow-up Information     Faith Patricelli, Samule Ohm, MD. Schedule an appointment as soon as possible for a visit in 1 week(s).   Specialty: Obstetrics and Gynecology Contact information: [address] [place] [address] [phone]                 Signed: Michael Litter 01/07/2023, 7:02 AM

## 2023-01-08 ENCOUNTER — Telehealth: Payer: Self-pay | Admitting: Physician Assistant

## 2023-01-08 NOTE — Telephone Encounter (Signed)
Spoke with patient to discuss lab results from clinic visit on 5/31. Patient is recovering well from her hysterectomy and plans to proceed with the lymph node biopsy as scheduled on 01/13/23. Patient agreeable with plan for me to call with biopsy results when available. No additional needs at this time.

## 2023-01-11 ENCOUNTER — Encounter (HOSPITAL_COMMUNITY): Payer: Self-pay | Admitting: Obstetrics and Gynecology

## 2023-01-12 ENCOUNTER — Other Ambulatory Visit: Payer: Self-pay | Admitting: Radiology

## 2023-01-12 DIAGNOSIS — C73 Malignant neoplasm of thyroid gland: Secondary | ICD-10-CM

## 2023-01-12 NOTE — H&P (Signed)
Chief Complaint: Patient was seen in consultation today for left neck mass at the request of Swayne,David  Referring Physician(s): Swayne,David  Supervising Physician: Gilmer Mor  Patient Status: [facility] - Out-pt  History of Present Illness: Breanna Lucero is a 47 y.o. female who presented to [facility] physicians complaining of left lateral neck swelling.  She subsequently underwent CT soft tissue neck 12/26/2022 that demonstrated pathologic left level 2A lymph node concerning for metastatic disease with recommendation for tissue sampling as well as visual inspection of fairness to assess for primary mucosal lesion.  Patient has been referred to IR for left neck mass biopsy.  Imaging was reviewed and approved by Dr. Loreta Ave, IR.  Patient denies chills, fever, appetite change, fatigue, SOB, CP, leg swelling, dizziness, headaches or weakness. She endorses mild abdominal pain from recent hysterectomy. She is n.p.o. per order.  Past Medical History:  Diagnosis Date   Anemia    menorrhagia induced   ASCUS (atypical squamous cells of undetermined significance) on Pap smear    neg high risk hpv   Cancer (HCC) 09/2002   Thyroid   Goiter    Heart murmur    as a child   Hypertension    Hypothyroid    Hypothyroid     Past Surgical History:  Procedure Laterality Date   ABDOMINAL HYSTERECTOMY Bilateral 01/05/2023   Procedure: ABDOMINAL HYSTERECTOMY, BILATERAL SALPINGECTOMY;  Surgeon: Jaymes Graff, MD;  Location: [facility] OR;  Service: Gynecology;  Laterality: Bilateral;   CESAREAN SECTION     x 2   COLPOSCOPY     LAPAROSCOPY  01/05/2023   Procedure: LAPAROSCOPY DIAGNOSTIC;  Surgeon: Jaymes Graff, MD;  Location: [facility] OR;  Service: Gynecology;;   THYROID SURGERY  2004    Allergies: Patient has no known allergies.  Medications: Prior to Admission medications   Medication Sig Start Date End Date Taking? Authorizing Provider  acetaminophen (TYLENOL) 325 MG tablet Take 2 tablets (650 mg  total) by mouth every 6 (six) hours as needed for mild pain. 01/07/23   Jaymes Graff, MD  ferrous sulfate 325 (65 FE) MG tablet Take 325 mg by mouth 3 (three) times daily with meals. 11/25/22   [provider]  fluticasone (FLONASE) 50 MCG/ACT nasal spray Place 2 sprays into both nostrils daily. Patient taking differently: Place 2 sprays into both nostrils daily as needed for allergies. 12/09/22   Leath-Warren, Sadie Haber, NP  Garlic 100 MG TABS Take 100 mg by mouth 2 (two) times daily.    [provider]  glycerin adult 2 g suppository Place 1 suppository rectally as needed for constipation. 01/07/23   Jaymes Graff, MD  ibuprofen (ADVIL) 600 MG tablet Take 1 tablet (600 mg total) by mouth every 6 (six) hours as needed for mild pain. 01/07/23   Jaymes Graff, MD  levothyroxine (SYNTHROID) 125 MCG tablet Take 1 tablet (125 mcg total) by mouth daily. Patient taking differently: Take 150 mcg by mouth daily. "Must take the Brand" 09/07/20   Romero Belling, MD  metoCLOPramide (REGLAN) 10 MG tablet Take 1 tablet (10 mg total) by mouth 4 (four) times daily -  before meals and at bedtime for 6 days. 01/07/23 01/13/23  Jaymes Graff, MD  ondansetron (ZOFRAN) 4 MG tablet Take 1 tablet (4 mg total) by mouth every 6 (six) hours as needed for nausea. 01/07/23   Jaymes Graff, MD  oxyCODONE 10 MG TABS Take 1 tablet (10 mg total) by mouth every 4 (four) hours as needed for moderate pain. 01/07/23  Dillard, Samule Ohm, MD  polyethylene glycol (MIRALAX / GLYCOLAX) 17 g packet Take 17 g by mouth 2 (two) times daily. 01/07/23   Jaymes Graff, MD  progesterone (PROMETRIUM) 200 MG capsule Take 200 mg by mouth at bedtime.    [provider]  valsartan-hydrochlorothiazide (DIOVAN-HCT) 320-12.5 MG tablet Take 1 tablet by mouth daily.    [provider]  Vitamin D, Ergocalciferol, (DRISDOL) 1.25 MG (50000 UNIT) CAPS capsule Take 50,000 Units by mouth once a week. 11/09/22   [provider]      Family History  Problem Relation Age of Onset   Heart disease Father    Thyroid disease Father     Social History   Socioeconomic History   Marital status: Married    Spouse name: Not on file   Number of children: Not on file   Years of education: Not on file   Highest education level: Not on file  Occupational History   Not on file  Tobacco Use   Smoking status: Never   Smokeless tobacco: Never  Vaping Use   Vaping Use: Never used  Substance and Sexual Activity   Alcohol use: No    Alcohol/week: 0.0 standard drinks of alcohol   Drug use: No   Sexual activity: Yes    Birth control/protection: None  Other Topics Concern   Not on file  Social History Narrative   Not on file   Social Determinants of Health   Financial Resource Strain: Not on file  Food Insecurity: Not on file  Transportation Needs: Not on file  Physical Activity: Not on file  Stress: Not on file  Social Connections: Not on file     Review of Systems: A 12 point ROS discussed and pertinent positives are indicated in the HPI above.  All other systems are negative.  Review of Systems  Constitutional:  Negative for appetite change, chills, fatigue and fever.  Respiratory:  Negative for shortness of breath.   Cardiovascular:  Negative for chest pain and leg swelling.  Gastrointestinal:  Positive for abdominal pain. Negative for nausea and vomiting.  Neurological:  Negative for dizziness, weakness and headaches.    Vital Signs: BP (!) 140/81   Pulse 70   Temp 98.1 F (36.7 C)   Resp 20   Ht 5\' 3"  (1.6 m)   Wt 220 lb (99.8 kg)   SpO2 99%   BMI 38.97 kg/m   Patient is a full CODE STATUS  Physical Exam Vitals reviewed.  Constitutional:      General: She is not in acute distress.    Appearance: Normal appearance. She is not ill-appearing.  HENT:     Head: Normocephalic and atraumatic.     Mouth/Throat:     Mouth: Mucous membranes are dry.     Pharynx: Oropharynx is clear.  Neck:      Comments: Palpable firm nodule to left anterior neck Cardiovascular:     Rate and Rhythm: Normal rate and regular rhythm.     Heart sounds: No murmur heard. Pulmonary:     Effort: Pulmonary effort is normal.     Breath sounds: Normal breath sounds.  Abdominal:     Comments: Patient has clean, dry dressing to suprapubic area from recent hysterectomy  Musculoskeletal:     Right lower leg: No edema.     Left lower leg: No edema.  Skin:    General: Skin is warm and dry.  Neurological:     Mental Status: She is alert and  oriented to person, place, and time.  Psychiatric:        Mood and Affect: Mood normal.        Behavior: Behavior normal.        Thought Content: Thought content normal.        Judgment: Judgment normal.     Imaging: CT SOFT TISSUE NECK W CONTRAST  Result Date: 12/26/2022 CLINICAL DATA:  Left-sided neck mass.  History of thyroid cancer. EXAM: CT NECK WITH CONTRAST TECHNIQUE: Multidetector CT imaging of the neck was performed using the standard protocol following the bolus administration of intravenous contrast. RADIATION DOSE REDUCTION: This exam was performed according to the departmental dose-optimization program which includes automated exposure control, adjustment of the mA and/or kV according to patient size and/or use of iterative reconstruction technique. CONTRAST:  75mL ISOVUE-300 IOPAMIDOL (ISOVUE-300) INJECTION 61% COMPARISON:  Neck ultrasound 12/24/2022 FINDINGS: Pharynx and larynx: No mass identified within limitations of dental streak artifact through the oral cavity and oropharynx. Patent airway. No fluid collection or inflammatory changes in the parapharyngeal or retropharyngeal spaces. Salivary glands: No inflammation, mass, or stone. Thyroid: Status post thyroidectomy. Lymph nodes: Pathologic left level IIa lymph node measuring 4.0 x 2.3 x 1.4 cm with areas of internal necrosis or cystic change. Additional slightly prominent but small lymph nodes elsewhere in  left levels II and III which are all subcentimeter in short axis. No other frankly enlarged lymph nodes in the neck on either side. Vascular: Major vascular structures of the neck are patent. Limited intracranial: Unremarkable. Visualized orbits: Unremarkable. Mastoids and visualized paranasal sinuses: Mild-to-moderate mucosal thickening in the right sphenoid sinus. Clear mastoid air cells. Skeleton: No suspicious osseous lesion.  Mild cervical spondylosis. Upper chest: Clear lung apices. Other: None. IMPRESSION: Pathologic left level IIa lymph node concerning for metastatic disease. Recommend tissue sampling as well as visual inspection of the pharynx to assess for a primary mucosal lesion. Electronically Signed   By: Sebastian Ache M.D.   On: 12/26/2022 17:03   US SOFT TISSUE HEAD & NECK (NON-THYROID)  Result Date: 12/25/2022 CLINICAL DATA:  Palpable left neck lump for 2 months. Prior total thyroidectomy in 2004. EXAM: ULTRASOUND OF HEAD/NECK SOFT TISSUES TECHNIQUE: Ultrasound examination of the head and neck soft tissues was performed in the area of clinical concern. COMPARISON:  None available FINDINGS: Targeted sonographic evaluation of the area of concern in the left superior neck demonstrates an ovoid hypoechoic structure measuring 4.2 x 3.6 x 1.6 cm, most consistent with a abnormally enlarged lymph node. IMPRESSION: 4.2 x 3.6 x 1.6 cm ovoid hypoechoic structure in the left submandibular region, corresponding to the palpable abnormality is most likely a abnormally enlarged lymph node, suspicious for metastatic disease. Further evaluation with contrast-enhanced soft tissue neck CT is recommended. Electronically Signed   By: Acquanetta Belling M.D.   On: 12/25/2022 11:15    Labs:  CBC: Recent Labs    12/30/22 1529 01/02/23 1420 01/06/23 0149 01/13/23 0926  WBC 5.4 7.0 11.8* 8.8  HGB 11.4* 11.5* 8.4* 9.5*  HCT 36.9 34.8* 25.2* 30.7*  PLT 306 347 322 483*    COAGS: No results for input(s): "INR",  "APTT" in the last 8760 hours.  BMP: Recent Labs    12/30/22 1529 01/02/23 1420 01/06/23 0149  NA 133* 136 137  K 3.9 3.5 3.9  CL 102 102 105  CO2 24 24 23   GLUCOSE 77 86 134*  BUN 9 14 9   CALCIUM 9.2 9.7 8.5*  CREATININE 0.82 0.83 0.92  GFRNONAA >60 >60 >60    LIVER FUNCTION TESTS: Recent Labs    01/02/23 1420  BILITOT 0.4  AST 19  ALT 18  ALKPHOS 63  PROT 7.9  ALBUMIN 4.5    TUMOR MARKERS: No results for input(s): "AFPTM", "CEA", "CA199", "CHROMGRNA" in the last 8760 hours.  Assessment and Plan:  47 year old female PMHx significant for thyroid cancer (2004), surgical hypothyroidism, anemia and HTN presents to IR for left neck mass biopsy.  Patient resting in bed with husband at bedside. She is alert and oriented, calm and pleasant. She is in no distress.  Risks and benefits of left neck mass biopsy with moderate sedation was discussed with the patient and/or patient's family including, but not limited to bleeding, infection, damage to adjacent structures or low yield requiring additional tests.  All of the questions were answered and there is agreement to proceed.  Consent signed and in chart.  Thank you for this interesting consult.  I greatly enjoyed meeting DARELY NOVEL and look forward to participating in their care.  A copy of this report was sent to the requesting provider on this date.  Electronically Signed: Shon Hough, NP 01/13/2023, 9:51 AM   I spent a total of 20 minutes in face to face in clinical consultation, greater than 50% of which was counseling/coordinating care for left neck mass.

## 2023-01-12 NOTE — Progress Notes (Signed)
Patient for CT guided Deep Neck Mass Biopsy on Tues 01/13/2023, I called and spoke with the patient on the phone and gave pre-procedure instructions. Pt was made aware to be here at 9a, NPO after MN prior to procedure as well as driver post procedure/recovery/discharge. Pt stated understanding.  Called 01/13/2023

## 2023-01-13 ENCOUNTER — Ambulatory Visit
Admission: RE | Admit: 2023-01-13 | Discharge: 2023-01-13 | Disposition: A | Discharge status: Home / Self Care | Payer: 59 | Source: Ambulatory Visit | Attending: Family Medicine | Admitting: Family Medicine

## 2023-01-13 ENCOUNTER — Other Ambulatory Visit (HOSPITAL_COMMUNITY): Payer: Self-pay | Admitting: Family Medicine

## 2023-01-13 DIAGNOSIS — R221 Localized swelling, mass and lump, neck: Secondary | ICD-10-CM

## 2023-01-13 DIAGNOSIS — R599 Enlarged lymph nodes, unspecified: Secondary | ICD-10-CM

## 2023-01-13 DIAGNOSIS — C73 Malignant neoplasm of thyroid gland: Secondary | ICD-10-CM

## 2023-01-13 DIAGNOSIS — R9389 Abnormal findings on diagnostic imaging of other specified body structures: Secondary | ICD-10-CM

## 2023-01-13 HISTORY — PX: IR US GUIDE BX ASP/DRAIN: IMG2392

## 2023-01-13 LAB — CBC
HCT: 30.7 % — ABNORMAL LOW (ref 36.0–46.0)
Hemoglobin: 9.5 g/dL — ABNORMAL LOW (ref 12.0–15.0)
MCH: 26.7 pg (ref 26.0–34.0)
MCHC: 30.9 g/dL (ref 30.0–36.0)
MCV: 86.2 fL (ref 80.0–100.0)
Platelets: 483 10*3/uL — ABNORMAL HIGH (ref 150–400)
RBC: 3.56 MIL/uL — ABNORMAL LOW (ref 3.87–5.11)
RDW: 16.7 % — ABNORMAL HIGH (ref 11.5–15.5)
WBC: 8.8 10*3/uL (ref 4.0–10.5)
nRBC: 0 % (ref 0.0–0.2)

## 2023-01-13 LAB — PROTIME-INR
INR: 1 (ref 0.8–1.2)
Prothrombin Time: 13.8 seconds (ref 11.4–15.2)

## 2023-01-13 MED ORDER — MIDAZOLAM HCL 2 MG/2ML IJ SOLN
INTRAMUSCULAR | Status: AC
  Filled 2023-01-13: qty 2

## 2023-01-13 MED ORDER — LIDOCAINE HCL (PF) 1 % IJ SOLN
10.0000 mL | Freq: Once | INTRAMUSCULAR | Status: AC
  Administered 2023-01-13: 10 mL
  Filled 2023-01-13: qty 10

## 2023-01-13 MED ORDER — SODIUM CHLORIDE 0.9 % IV SOLN: INTRAVENOUS | Status: DC

## 2023-01-13 MED ORDER — MIDAZOLAM HCL 2 MG/2ML IJ SOLN
INTRAMUSCULAR | Status: AC | PRN
  Administered 2023-01-13: 1 mg via INTRAVENOUS

## 2023-01-13 MED ORDER — FENTANYL CITRATE (PF) 100 MCG/2ML IJ SOLN
INTRAMUSCULAR | Status: AC | PRN
  Administered 2023-01-13: 50 ug via INTRAVENOUS

## 2023-01-13 MED ORDER — FENTANYL CITRATE (PF) 100 MCG/2ML IJ SOLN
INTRAMUSCULAR | Status: AC
  Filled 2023-01-13: qty 2

## 2023-01-13 NOTE — Progress Notes (Signed)
Patient clinically stable post left neck mass biopsy, tolerated well. Vitals stable pre and post procedure. Denies complaints post procedure.received Versed 1 mg along with Fentanyl 50 mcg IV for procedure. Report given to Marni Griffon RN post procedure./16/specials.

## 2023-01-13 NOTE — Procedures (Signed)
Interventional Radiology Procedure Note  Procedure:  [place] & CT guided biopsy of left neck lymph node/deep neck mass biopsy. Mx 18g core of lymph node.    Complications: None Specimen: Mx 18g core to lab EBL: 0  Recommendations:  - Ok to shower tomorrow - Do not submerge for 7 days - Routine line care  - DC 1 hr  Signed,  Yvone Neu. Loreta Ave, DO

## 2023-01-13 NOTE — Progress Notes (Signed)
Discharge instructions given to patient and husband with questions answered, ice pack for home post procedure given.

## 2023-01-14 LAB — SURGICAL PATHOLOGY

## 2023-01-20 ENCOUNTER — Telehealth: Payer: Self-pay | Admitting: Physician Assistant

## 2023-01-20 ENCOUNTER — Encounter: Payer: Self-pay | Admitting: Physician Assistant

## 2023-01-20 DIAGNOSIS — R591 Generalized enlarged lymph nodes: Secondary | ICD-10-CM

## 2023-01-20 NOTE — Telephone Encounter (Signed)
Contacted patient to discuss lymph node biopsy results from 06/11. Biopsy is negative for malignancy and TB however has epithelioid noncaseating granulomatous inflammation which is concerning for sarcoidosis. Patient will need CT chest to evaluate for pulmonary involvement. Patient will be referred to either pulmonology or rheumatology depending on CT results. She is agreeable with this plan and has no additional needs at this time.

## 2023-01-22 ENCOUNTER — Encounter (HOSPITAL_COMMUNITY): Payer: Self-pay | Admitting: Radiology

## 2023-01-22 ENCOUNTER — Ambulatory Visit (HOSPITAL_COMMUNITY)
Admission: RE | Admit: 2023-01-22 | Discharge: 2023-01-22 | Disposition: A | Discharge status: Home / Self Care | Payer: 59 | Source: Ambulatory Visit | Attending: Physician Assistant | Admitting: Physician Assistant

## 2023-01-22 DIAGNOSIS — R591 Generalized enlarged lymph nodes: Secondary | ICD-10-CM | POA: Diagnosis present

## 2023-01-22 MED ORDER — IOHEXOL 300 MG/ML  SOLN
75.0000 mL | Freq: Once | INTRAMUSCULAR | Status: AC | PRN
  Administered 2023-01-22: 75 mL via INTRAVENOUS

## 2023-01-22 MED ORDER — SODIUM CHLORIDE (PF) 0.9 % IJ SOLN
INTRAMUSCULAR | Status: AC
  Filled 2023-01-22: qty 50

## 2023-01-26 ENCOUNTER — Telehealth: Payer: Self-pay | Admitting: Physician Assistant

## 2023-01-26 DIAGNOSIS — N2889 Other specified disorders of kidney and ureter: Secondary | ICD-10-CM

## 2023-01-26 NOTE — Telephone Encounter (Signed)
I notified Sinthia Karabin Bechtold by phone regarding CT chest results results. Findings did not show evidence of pulmonary sarcoidosis. 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   I discussed incidental findings as well including left nonspecific 5 mm pulmonary nodule and mild bilateral pelvicaliectasis. Based on the size of pulmonary nodule she does not need follow up if low risk. She is a never smoker however I encouraged her to notify PCP of the pulmonary nodule to discuss it further in terms of risk factors/monitoring.  For the mild bilateral pelvicaliectasis I will order US renal for further evaluation. Chart review shows kidney function was normal and patient is denying symptoms of obstruction. She reports feeling her bladder was very full after drinking the contrast for the CT, which could be the cause. I discussed results with radiologist Dr. Dorothey Baseman who agrees with this plan.  All of patient's questions were answered and she expressed understanding of the plan provided.

## 2023-02-16 ENCOUNTER — Ambulatory Visit (HOSPITAL_COMMUNITY)
Admission: RE | Admit: 2023-02-16 | Discharge: 2023-02-16 | Disposition: A | Discharge status: Home / Self Care | Payer: 59 | Source: Ambulatory Visit | Attending: Physician Assistant | Admitting: Physician Assistant

## 2023-02-16 DIAGNOSIS — N2889 Other specified disorders of kidney and ureter: Secondary | ICD-10-CM | POA: Insufficient documentation

## 2023-02-17 ENCOUNTER — Other Ambulatory Visit: Payer: Self-pay

## 2023-02-20 ENCOUNTER — Telehealth: Payer: Self-pay | Admitting: Physician Assistant

## 2023-02-20 DIAGNOSIS — N2889 Other specified disorders of kidney and ureter: Secondary | ICD-10-CM

## 2023-02-20 NOTE — Telephone Encounter (Signed)
I notified Breanna Lucero by phone regarding renal US results performed for the incidental finding of bilateral pelvicaliectasis on CT chest on 01/22/23. Findings do not show hydronephrosis. The imaging does show volume per kidney is significantly different with right having 295 mL and left having 135 mL. Patient does have history of hypertension however no diagnosis of kidney disease. Chart review shows she has had normal creatinine levels on labs for the last 2 months.  Based on US findings, will send routine urology referral for further evaluation/monitoring.   Patient will follow with [facility] for new diagnosis of sarcoidosis and does not need further work up with oncology. All of patient's questions were answered and she expressed understanding of the plan provided.

## 2023-02-21 ENCOUNTER — Ambulatory Visit
Admission: EM | Admit: 2023-02-21 | Discharge: 2023-02-21 | Disposition: A | Discharge status: Home / Self Care | Payer: 59 | Source: Home / Self Care

## 2023-02-21 ENCOUNTER — Encounter: Payer: Self-pay | Admitting: Emergency Medicine

## 2023-02-21 DIAGNOSIS — J029 Acute pharyngitis, unspecified: Secondary | ICD-10-CM | POA: Insufficient documentation

## 2023-02-21 LAB — POCT RAPID STREP A (OFFICE): Rapid Strep A Screen: NEGATIVE

## 2023-02-21 MED ORDER — AMOXICILLIN 500 MG PO CAPS: 500.0000 mg | ORAL_CAPSULE | Freq: Two times a day (BID) | ORAL | 0 refills | Status: AC

## 2023-02-21 MED ORDER — LIDOCAINE VISCOUS HCL 2 % MT SOLN: OROMUCOSAL | 0 refills | Status: AC

## 2023-02-21 NOTE — Discharge Instructions (Addendum)
Rapid strep test is negative, throat culture is pending.  As discussed, if the result of the culture is negative, you will be contacted and advised to stop the antibiotic. Take medication as prescribed. Increase fluids and allow for plenty of rest. Recommend Tylenol or ibuprofen as needed for pain, fever, or general discomfort. Recommend throat lozenges, Chloraseptic or honey to help with throat pain. Warm salt water gargles 3-4 times daily to help with throat pain or discomfort. Recommend a diet with soft foods to include soups, broths, puddings, yogurt, Jell-O's, or popsicles until symptoms improve. Follow-up if symptoms do not improve.

## 2023-02-21 NOTE — ED Triage Notes (Signed)
Sore throat x 3 days

## 2023-02-21 NOTE — ED Provider Notes (Signed)
[facility]    CSN: [id number] Arrival date & time: 02/21/23  1444      History   Chief Complaint No chief complaint on file.   HPI Breanna Lucero is a 47 y.o. female.   The history is provided by the patient.   The patient presents for complaints of sore throat.  Symptoms started approximately 3 days ago.  Patient states symptoms are worse on the left side of her throat.  She reports previously, she was diagnosed with sarcoidosis after a thyroid biopsy was completed.  The patient denies fever, chills, nasal congestion, cough, abdominal pain, nausea, vomiting, or diarrhea.  Patient denies any obvious known sick contacts.  Reports she has been taking ibuprofen for her symptoms with some relief. Past Medical History:  Diagnosis Date   Anemia    menorrhagia induced   ASCUS (atypical squamous cells of undetermined significance) on Pap smear    neg high risk hpv   Cancer (HCC) 09/2002   Thyroid   Goiter    Heart murmur    as a child   Hypertension    Hypothyroid    Hypothyroid     Patient Active Problem List   Diagnosis Date Noted   Excessive and frequent menstruation with regular cycle 01/05/2023   S/P TAH (total abdominal hysterectomy) 01/05/2023   Iron deficiency anemia 09/17/2022   Thyroid cancer (HCC) 09/07/2020   Heart murmur 11/11/2012   Anemia 11/11/2012   Hypothyroid 11/11/2012   History of abnormal Pap smear 11/11/2012   ANKLE SPRAIN, LEFT 06/03/2007    Past Surgical History:  Procedure Laterality Date   ABDOMINAL HYSTERECTOMY Bilateral 01/05/2023   Procedure: ABDOMINAL HYSTERECTOMY, BILATERAL SALPINGECTOMY;  Surgeon: Jaymes Graff, MD;  Location: [facility] OR;  Service: Gynecology;  Laterality: Bilateral;   CESAREAN SECTION     x 2   COLPOSCOPY     IR US GUIDE BX ASP/DRAIN  01/13/2023   LAPAROSCOPY  01/05/2023   Procedure: LAPAROSCOPY DIAGNOSTIC;  Surgeon: Jaymes Graff, MD;  Location: [facility] OR;  Service: Gynecology;;   THYROID SURGERY  2004    OB  History     Gravida  4   Para  3   Term  1   Preterm      AB  1   Living         SAB  1   IAB      Ectopic      Multiple      Live Births               Home Medications    Prior to Admission medications   Medication Sig Start Date End Date Taking? Authorizing Provider  amoxicillin (AMOXIL) 500 MG capsule Take 1 capsule (500 mg total) by mouth 2 (two) times daily for 10 days. 02/21/23 03/03/23 Yes Aja Whitehair-Warren, Sadie Haber, NP  lidocaine (XYLOCAINE) 2 % solution Gargle and spit 5mL every 6 hours as needed for throat pain or discomfort. 02/21/23  Yes Faustino Luecke-Warren, Sadie Haber, NP  ferrous sulfate 325 (65 FE) MG tablet Take 325 mg by mouth 3 (three) times daily with meals. 11/25/22   [provider]  fluticasone (FLONASE) 50 MCG/ACT nasal spray Place 2 sprays into both nostrils daily. Patient taking differently: Place 2 sprays into both nostrils daily as needed for allergies. 12/09/22   Amandine Covino-Warren, Sadie Haber, NP  Garlic 100 MG TABS Take 100 mg by mouth 2 (two) times daily.    [provider]  ibuprofen (ADVIL) 600  MG tablet Take 1 tablet (600 mg total) by mouth every 6 (six) hours as needed for mild pain. 01/07/23   Jaymes Graff, MD  levothyroxine (SYNTHROID) 125 MCG tablet Take 1 tablet (125 mcg total) by mouth daily. Patient taking differently: Take 150 mcg by mouth daily. "Must take the Brand" 09/07/20   Romero Belling, MD  valsartan-hydrochlorothiazide (DIOVAN-HCT) 320-12.5 MG tablet Take 1 tablet by mouth daily.    [provider]  Vitamin D, Ergocalciferol, (DRISDOL) 1.25 MG (50000 UNIT) CAPS capsule Take 50,000 Units by mouth once a week. 11/09/22   [provider]    Family History Family History  Problem Relation Age of Onset   Heart disease Father    Thyroid disease Father     Social History Social History   Tobacco Use   Smoking status: Never   Smokeless tobacco: Never  Vaping Use   Vaping status: Never Used  Substance  Use Topics   Alcohol use: No    Alcohol/week: 0.0 standard drinks of alcohol   Drug use: No     Allergies   Patient has no known allergies.   Review of Systems Review of Systems Per HPI  Physical Exam Triage Vital Signs ED Triage Vitals  Encounter Vitals Group     BP 02/21/23 1450 (!) 155/81     Systolic BP Percentile --      Diastolic BP Percentile --      Pulse Rate 02/21/23 1450 76     Resp 02/21/23 1450 16     Temp 02/21/23 1450 98.2 F (36.8 C)     Temp Source 02/21/23 1450 Oral     SpO2 02/21/23 1450 97 %     Weight --      Height --      Head Circumference --      Peak Flow --      Pain Score 02/21/23 1451 6     Pain Loc --      Pain Education --      Exclude from Growth Chart --    No data found.  Updated Vital Signs BP (!) 155/81 (BP Location: Right Arm)   Pulse 76   Temp 98.2 F (36.8 C) (Oral)   Resp 16   LMP 12/04/2022 (Exact Date) Comment: Urine pregnancy test negative on 01/05/23  SpO2 97%   Visual Acuity Right Eye Distance:   Left Eye Distance:   Bilateral Distance:    Right Eye Near:   Left Eye Near:    Bilateral Near:     Physical Exam Vitals and nursing note reviewed.  Constitutional:      General: She is not in acute distress.    Appearance: Normal appearance.  HENT:     Head: Normocephalic.     Right Ear: Tympanic membrane, ear canal and external ear normal.     Left Ear: Tympanic membrane, ear canal and external ear normal.     Nose: Nose normal.     Mouth/Throat:     Lips: Pink.     Mouth: Mucous membranes are moist.     Pharynx: Pharyngeal swelling and posterior oropharyngeal erythema present. No uvula swelling or postnasal drip.     Comments: Ulcerative lesion seen on left tonsil Eyes:     Extraocular Movements: Extraocular movements intact.     Conjunctiva/sclera: Conjunctivae normal.     Pupils: Pupils are equal, round, and reactive to light.  Cardiovascular:     Rate and Rhythm: Normal rate and regular  rhythm.      Pulses: Normal pulses.     Heart sounds: Normal heart sounds.  Pulmonary:     Effort: Pulmonary effort is normal. No respiratory distress.     Breath sounds: Normal breath sounds. No stridor. No wheezing, rhonchi or rales.  Abdominal:     General: Bowel sounds are normal.     Palpations: Abdomen is soft.     Tenderness: There is no abdominal tenderness.  Musculoskeletal:     Cervical back: Normal range of motion.  Lymphadenopathy:     Cervical: No cervical adenopathy.  Skin:    General: Skin is warm and dry.  Neurological:     General: No focal deficit present.     Mental Status: She is alert and oriented to person, place, and time.  Psychiatric:        Mood and Affect: Mood normal.        Behavior: Behavior normal.      UC Treatments / Results  Labs (all labs ordered are listed, but only abnormal results are displayed) Labs Reviewed  CULTURE, GROUP A STREP [facility])  POCT RAPID STREP A (OFFICE)    EKG   Radiology No results found.  Procedures Procedures (including critical care time)  Medications Ordered in UC Medications - No data to display  Initial Impression / Assessment and Plan / UC Course  I have reviewed the triage vital signs and the nursing notes.  Pertinent labs & imaging results that were available during my care of the patient were reviewed by me and considered in my medical decision making (see chart for details).   The patient is well-appearing, she is in no acute distress, vital signs are stable, although she is mildly hypertensive.  Rapid strep test was negative, throat culture is pending.  On exam, patient did have ulcerative lesions seen on the left tonsil, she also has a history of sarcoid from her left thyroid.  Will treat empirically with amoxicillin 500 mg twice daily while throat culture is pending.  Viscous lidocaine 2% was also prescribed for patient's throat pain or discomfort.  Patient was advised if the result of the culture is negative,  she will be contacted and advised to stop the antibiotic.  Patient was given supportive care recommendations to include warm salt water gargles, continuing over-the-counter ibuprofen for pain or discomfort, and a soft diet.  Patient is in agreement with this plan of care and verbalizes understanding.  All questions were answered.  Patient stable for discharge.   Final Clinical Impressions(s) / UC Diagnoses   Final diagnoses:  Acute pharyngitis, unspecified etiology     Discharge Instructions      Rapid strep test is negative, throat culture is pending.  As discussed, if the result of the culture is negative, you will be contacted and advised to stop the antibiotic. Take medication as prescribed. Increase fluids and allow for plenty of rest. Recommend Tylenol or ibuprofen as needed for pain, fever, or general discomfort. Recommend throat lozenges, Chloraseptic or honey to help with throat pain. Warm salt water gargles 3-4 times daily to help with throat pain or discomfort. Recommend a diet with soft foods to include soups, broths, puddings, yogurt, Jell-O's, or popsicles until symptoms improve. Follow-up if symptoms do not improve.       ED Prescriptions     Medication Sig Dispense Auth. Provider   amoxicillin (AMOXIL) 500 MG capsule Take 1 capsule (500 mg total) by mouth 2 (two) times daily for 10 days.  20 capsule Nellene Courtois-Warren, Sadie Haber, NP   lidocaine (XYLOCAINE) 2 % solution Gargle and spit 5mL every 6 hours as needed for throat pain or discomfort. 100 mL Jamall Strohmeier-Warren, Sadie Haber, NP      PDMP not reviewed this encounter.   Abran Cantor, NP 02/21/23 1534

## 2023-02-23 LAB — CULTURE, GROUP A STREP (THRC)

## 2023-02-24 LAB — CULTURE, GROUP A STREP (THRC)

## 2023-02-26 ENCOUNTER — Ambulatory Visit: Payer: 59 | Attending: Internal Medicine | Admitting: Internal Medicine

## 2023-02-26 ENCOUNTER — Encounter: Payer: Self-pay | Admitting: Internal Medicine

## 2023-02-26 VITALS — BP 161/95 | HR 67 | Resp 14 | Ht 63.0 in | Wt 218.0 lb

## 2023-02-26 DIAGNOSIS — R899 Unspecified abnormal finding in specimens from other organs, systems and tissues: Secondary | ICD-10-CM | POA: Diagnosis not present

## 2023-02-26 DIAGNOSIS — R591 Generalized enlarged lymph nodes: Secondary | ICD-10-CM

## 2023-02-26 DIAGNOSIS — E559 Vitamin D deficiency, unspecified: Secondary | ICD-10-CM | POA: Diagnosis not present

## 2023-02-26 DIAGNOSIS — L219 Seborrheic dermatitis, unspecified: Secondary | ICD-10-CM

## 2023-02-26 LAB — SEDIMENTATION RATE: Sed Rate: 14 mm/h (ref 0–20)

## 2023-02-26 NOTE — Progress Notes (Signed)
Office Visit Note  Patient: Breanna Lucero             Date of Birth: 02/07/1976           MRN: [id number]             PCP: [name], MD Referring: Shanon Ace,* Visit Date: 02/26/2023   Subjective:  New Patient (Initial Visit) (Patient states she has swollen lymphs. )   History of Present Illness: Breanna Lucero is a 47 y.o. female here for evaluation of sarcoidosis with cervical lymphadenopathy. 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  Starting around February of this year she noticed a nodule on the left side of the neck.  This was not particularly symptomatic so first just monitor this waited for her annual primary care clinic follow-up to discuss this.  Did progressively become enlarged starting to have some soreness in the surrounding area.  She was also seen for what was apparently discussed as suspicious for strep throat but never had any strep test or course of antibiotics at the time.  Found to have a left sided nodule concerning for local metastases by CT scan. Ultrasound guided biopsy was negative for malignancy, it showed noncaseating granulomatous inflammation concerning for sarcoidosis. Subsequent chest CT did not show evidence of pulmonary disease. Serum ESR and CRP were very mildly above normal.  Since that workup last month the nodule has decreased in size and is not causing any local soreness or other associated symptoms at this time. She has some morning joint stiffness does not have severe pain does not has any visible swelling or discoloration.  She has a mild rash slightly red and flaky along the creases along her nose this is not particularly itchy or painful.  Has not noticed nodules or swelling in other locations.  Denies any new cough or shortness of breath.  Labs reviewed 01/13/23 Left cervical lymph node biopsy . LYMPH NODE, LEFT NECK; ULTRASOUND-GUIDED BIOPSY: - REACTIVE FIBROUS TISSUE WITH CHRONIC INFLAMMATION AND  AREAS SUGGESTIVE OF EPITHELIOID NONCASEATING GRANULOMATOUS INFLAMMATION. - DETACHED NECROINFLAMMATORY MATERIAL. - AFB AND GMS STAINS ARE NEGATIVE; CONTROLS WORKED APPROPRIATELY. - NEGATIVE FOR MALIGNANCY.  Activities of Daily Living:  Patient reports morning stiffness for 10 minutes.   Patient Denies nocturnal pain.  Difficulty dressing/grooming: Denies Difficulty climbing stairs: Denies Difficulty getting out of chair: Denies Difficulty using hands for taps, buttons, cutlery, and/or writing: Denies  Review of Systems  Constitutional:  Positive for fatigue.  HENT:  Negative for mouth sores and mouth dryness.   Eyes:  Negative for dryness.  Respiratory:  Negative for shortness of breath.   Cardiovascular:  Negative for chest pain and palpitations.  Gastrointestinal:  Negative for blood in stool, constipation and diarrhea.  Endocrine: Negative for increased urination.  Genitourinary:  Negative for involuntary urination.  Musculoskeletal:  Positive for morning stiffness. Negative for joint pain, gait problem, joint pain, joint swelling, myalgias, muscle weakness, muscle tenderness and myalgias.  Skin:  Negative for color change, rash, hair loss and sensitivity to sunlight.  Allergic/Immunologic: Negative for susceptible to infections.  Neurological:  Positive for headaches. Negative for dizziness.  Hematological:  Positive for swollen glands.  Psychiatric/Behavioral:  Negative for depressed mood and sleep disturbance. The patient is not nervous/anxious.     PMFS History:  Patient Active Problem List   Diagnosis Date Noted   Lymphadenopathy 02/26/2023   Vitamin D deficiency 02/26/2023   Abnormal laboratory test 02/26/2023   Seborrheic  dermatitis 02/26/2023   Excessive and frequent menstruation with regular cycle 01/05/2023   S/P TAH (total abdominal hysterectomy) 01/05/2023   Iron deficiency anemia 09/17/2022   Thyroid cancer (HCC) 09/07/2020   Heart murmur 11/11/2012   Anemia  11/11/2012   Hypothyroid 11/11/2012   History of abnormal Pap smear 11/11/2012   ANKLE SPRAIN, LEFT 06/03/2007    Past Medical History:  Diagnosis Date   Anemia    menorrhagia induced   ASCUS (atypical squamous cells of undetermined significance) on Pap smear    neg high risk hpv   Cancer (HCC) 09/2002   Thyroid   Goiter    Heart murmur    as a child   Hypertension    Hypothyroid    Hypothyroid    Sarcoidosis     Family History  Problem Relation Age of Onset   Breast cancer Mother    Heart disease Father    Thyroid disease Father    Hypothyroidism Sister    Lymphoma Brother    Diabetes Brother    Past Surgical History:  Procedure Laterality Date   ABDOMINAL HYSTERECTOMY Bilateral 01/05/2023   Procedure: ABDOMINAL HYSTERECTOMY, BILATERAL SALPINGECTOMY;  Surgeon: Jaymes Graff, MD;  Location: [facility] OR;  Service: Gynecology;  Laterality: Bilateral;   CESAREAN SECTION     x 2   COLPOSCOPY     IR US GUIDE BX ASP/DRAIN  01/13/2023   LAPAROSCOPY  01/05/2023   Procedure: LAPAROSCOPY DIAGNOSTIC;  Surgeon: Jaymes Graff, MD;  Location: [facility] OR;  Service: Gynecology;;   THYROID SURGERY  2004   TOOTH EXTRACTION     Social History   Social History Narrative   Not on file    There is no immunization history on file for this patient.   Objective: Vital Signs: BP (!) 161/95 (BP Location: Right Arm, Patient Position: Sitting, Cuff Size: Normal)   Pulse 67   Resp 14   Ht 5\' 3"  (1.6 m)   Wt 218 lb (98.9 kg)   LMP 12/04/2022 (Exact Date) Comment: Urine pregnancy test negative on 01/05/23  BMI 38.62 kg/m    Physical Exam Constitutional:      Appearance: She is obese.  HENT:     Nose: Nose normal.     Mouth/Throat:     Mouth: Mucous membranes are moist.     Pharynx: Oropharynx is clear.  Eyes:     Conjunctiva/sclera: Conjunctivae normal.  Cardiovascular:     Rate and Rhythm: Normal rate and regular rhythm.  Pulmonary:     Effort: Pulmonary effort is normal.     Breath  sounds: Normal breath sounds.  Musculoskeletal:     Right lower leg: No edema.     Left lower leg: No edema.  Lymphadenopathy:     Cervical: No cervical adenopathy.  Skin:    General: Skin is warm and dry.     Findings: Rash present.     Comments: Faintly erythematous rash alongside nose extending partly down the nasolabial folds Normal-appearing nailfold capillaroscopy  Neurological:     Mental Status: She is alert.  Psychiatric:        Mood and Affect: Mood normal.      Musculoskeletal Exam:  Neck full ROM no tenderness Shoulders full ROM no tenderness or swelling Elbows full ROM no tenderness or swelling Wrists full ROM no tenderness or swelling Fingers full ROM no tenderness or swelling Knees full ROM no tenderness or swelling    Investigation: No additional findings.  Imaging: US RENAL  Result  Date: 02/19/2023 CLINICAL DATA:  Bilateral pelvicaliectasis seen on CT chest EXAM: RENAL / URINARY TRACT ULTRASOUND COMPLETE COMPARISON:  CT dated January 22, 2023. FINDINGS: Right Kidney: Renal measurements: 13.1 x 7.6 x 5.7 cm = volume: 295 mL. Echogenicity within normal limits. No mass or significant hydronephrosis visualized. Left Kidney: Renal measurements: 12.8 x 6.0 x 5.8 cm = volume: 135 mL. Echogenicity within normal limits. No mass or significant hydronephrosis visualized. Bladder: Appears normal for degree of bladder distention. Other: None. IMPRESSION: No hydronephrosis. Electronically Signed   By: Meda Klinefelter M.D.   On: 02/19/2023 12:58    Recent Labs: Lab Results  Component Value Date   WBC 8.8 01/13/2023   HGB 9.5 (L) 01/13/2023   PLT 483 (H) 01/13/2023   NA 137 01/06/2023   K 3.9 01/06/2023   CL 105 01/06/2023   CO2 23 01/06/2023   GLUCOSE 134 (H) 01/06/2023   BUN 9 01/06/2023   CREATININE 0.92 01/06/2023   BILITOT 0.4 01/02/2023   ALKPHOS 63 01/02/2023   AST 19 01/02/2023   ALT 18 01/02/2023   PROT 7.9 01/02/2023   ALBUMIN 4.5 01/02/2023   CALCIUM  8.5 (L) 01/06/2023    Speciality Comments: No specialty comments available.  Procedures:  No procedures performed Allergies: Patient has no known allergies.   Assessment / Plan:     Visit Diagnoses: Lymphadenopathy - Plan: Angiotensin converting enzyme, Sedimentation rate, COMPLETE METABOLIC PANEL WITH GFR, C-reactive protein, Anti-DNAse B antibody  Isolated lymph node swelling pathology concerning for sarcoidosis but no clear evidence of disease activity anywhere else including normal lung CT scan.  Will check markers for systemic disease activity including ACE, sedimentation rate, serum calcium, and CRP.  There was some question about strep throat or other upper respiratory infection that may have been a trigger will check anti-DNase B antibody.  Vitamin D deficiency - Plan: Vitamin D 1,25 dihydroxy  Checking activated form of vitamin D in setting of suspected sarcoidosis.  She is currently prescribed the 50,000 units weekly supplement regimen.  Seborrheic dermatitis  Facial rash looks most consistent with seborrheic dermatitis.  No appreciable papules or hyperpigmentation changes that I would expect as more typical for cutaneous sarcoidosis involvement.  Orders: Orders Placed This Encounter  Procedures   Angiotensin converting enzyme   Sedimentation rate   COMPLETE METABOLIC PANEL WITH GFR   Vitamin D 1,25 dihydroxy   C-reactive protein   Anti-DNAse B antibody   No orders of the defined types were placed in this encounter.    Follow-Up Instructions: Return in about 1 year (around 02/26/2024) for ?Sarcoidosis obs f/u 29yr.   Fuller Plan, MD  Note - This record has been created using [employer].  Chart creation errors have been sought, but may not always  have been located. Such creation errors do not reflect on  the standard of medical care.

## 2023-05-22 ENCOUNTER — Other Ambulatory Visit (HOSPITAL_COMMUNITY): Payer: Self-pay | Admitting: Family Medicine

## 2023-05-22 DIAGNOSIS — E7800 Pure hypercholesterolemia, unspecified: Secondary | ICD-10-CM

## 2023-05-26 ENCOUNTER — Ambulatory Visit (HOSPITAL_COMMUNITY): Payer: 59

## 2023-06-08 ENCOUNTER — Inpatient Hospital Stay (HOSPITAL_COMMUNITY): Admission: RE | Admit: 2023-06-08 | Payer: 59 | Source: Ambulatory Visit

## 2023-06-08 ENCOUNTER — Encounter (HOSPITAL_COMMUNITY): Payer: Self-pay

## 2023-06-24 ENCOUNTER — Encounter: Payer: 59 | Admitting: Internal Medicine

## 2023-09-29 ENCOUNTER — Ambulatory Visit
Admission: RE | Admit: 2023-09-29 | Discharge: 2023-09-29 | Disposition: A | Discharge status: Home / Self Care | Payer: 59 | Source: Ambulatory Visit | Attending: Nurse Practitioner | Admitting: Nurse Practitioner

## 2023-09-29 VITALS — BP 143/86 | HR 80 | Temp 98.5°F | Resp 20

## 2023-09-29 DIAGNOSIS — Z862 Personal history of diseases of the blood and blood-forming organs and certain disorders involving the immune mechanism: Secondary | ICD-10-CM

## 2023-09-29 DIAGNOSIS — R059 Cough, unspecified: Secondary | ICD-10-CM | POA: Diagnosis not present

## 2023-09-29 DIAGNOSIS — J22 Unspecified acute lower respiratory infection: Secondary | ICD-10-CM | POA: Diagnosis not present

## 2023-09-29 MED ORDER — AMOXICILLIN-POT CLAVULANATE 875-125 MG PO TABS: 1.0000 | ORAL_TABLET | Freq: Two times a day (BID) | ORAL | 0 refills | Status: AC

## 2023-09-29 MED ORDER — PROMETHAZINE-DM 6.25-15 MG/5ML PO SYRP: 5.0000 mL | ORAL_SOLUTION | Freq: Four times a day (QID) | ORAL | 0 refills | Status: AC | PRN

## 2023-09-29 NOTE — Discharge Instructions (Signed)
 Take medication as prescribed. Increase fluids and allow for plenty of rest. Continue over-the-counter Tylenol or Ibuprofen as needed for pain, fever, general discomfort. May use normal saline nasal spray for nasal congestion and runny nose.  You may use the nasal spray you have at home to help with the nasal congestion and runny nose. For the cough, recommend using a humidifier in the bedroom at nighttime during sleep and sleeping elevated on pillows while cough symptoms persist. Your cough may continue after you have completed the medication. If you feel well generally, continue OTC cough medication and cough drops. If you develop fever, wheezing, SOB or trouble breathing, follow-up with your PCP or in this clinic for further evaluation. Follow-up as needed.

## 2023-09-29 NOTE — ED Triage Notes (Signed)
 Pt reports she has a cough, chest discomfort, rattling in chest, and a lot of mucus x 2 weeks

## 2023-09-29 NOTE — ED Provider Notes (Signed)
 [facility]    CSN: [id number] Arrival date & time: 09/29/23  1234      History   Chief Complaint Chief Complaint  Patient presents with   Cough    Coughing,  fatigue, congestion, restless nights, etc. - Entered by patient    HPI Breanna Lucero is a 48 y.o. female.   The history is provided by the patient.   Patient presents for complaints of headache, cough, nasal congestion, and runny nose.  Symptoms have been present for the past 2 weeks.  Patient reports that she thinks she may have had a fever when symptoms initially started.  States that she felt that her symptoms were getting better, then they would get worse, and so on.  She denies ear pain, wheezing, shortness of breath, chest pain, abdominal pain, nausea, vomiting, diarrhea, or rash.  Patient reports she has been taking over-the-counter Mucinex for symptoms with minimal relief.  Of note, patient with history of sarcoidosis.  Past Medical History:  Diagnosis Date   Anemia    menorrhagia induced   ASCUS (atypical squamous cells of undetermined significance) on Pap smear    neg high risk hpv   Cancer (HCC) 09/2002   Thyroid   Goiter    Heart murmur    as a child   Hypertension    Hypothyroid    Hypothyroid    Sarcoidosis     Patient Active Problem List   Diagnosis Date Noted   Lymphadenopathy 02/26/2023   Vitamin D deficiency 02/26/2023   Abnormal laboratory test 02/26/2023   Seborrheic dermatitis 02/26/2023   Excessive and frequent menstruation with regular cycle 01/05/2023   S/P TAH (total abdominal hysterectomy) 01/05/2023   Iron deficiency anemia 09/17/2022   Thyroid cancer (HCC) 09/07/2020   Heart murmur 11/11/2012   Anemia 11/11/2012   Hypothyroid 11/11/2012   History of abnormal Pap smear 11/11/2012   Sprain of ankle 06/03/2007    Past Surgical History:  Procedure Laterality Date   ABDOMINAL HYSTERECTOMY Bilateral 01/05/2023   Procedure: ABDOMINAL HYSTERECTOMY, BILATERAL  SALPINGECTOMY;  Surgeon: Jaymes Graff, MD;  Location: [facility] OR;  Service: Gynecology;  Laterality: Bilateral;   CESAREAN SECTION     x 2   COLPOSCOPY     IR US GUIDE BX ASP/DRAIN  01/13/2023   LAPAROSCOPY  01/05/2023   Procedure: LAPAROSCOPY DIAGNOSTIC;  Surgeon: Jaymes Graff, MD;  Location: [facility] OR;  Service: Gynecology;;   THYROID SURGERY  2004   TOOTH EXTRACTION      OB History     Gravida  4   Para  3   Term  1   Preterm      AB  1   Living         SAB  1   IAB      Ectopic      Multiple      Live Births               Home Medications    Prior to Admission medications   Medication Sig Start Date End Date Taking? Authorizing Provider  amoxicillin-clavulanate (AUGMENTIN) 875-125 MG tablet Take 1 tablet by mouth every 12 (twelve) hours. 09/29/23  Yes Leath-Warren, Sadie Haber, NP  promethazine-dextromethorphan (PROMETHAZINE-DM) 6.25-15 MG/5ML syrup Take 5 mLs by mouth 4 (four) times daily as needed. 09/29/23  Yes Leath-Warren, Sadie Haber, NP  ferrous sulfate 325 (65 FE) MG tablet Take 325 mg by mouth 3 (three) times daily with meals. 11/25/22   [provider]  fluticasone (FLONASE) 50 MCG/ACT nasal spray Place 2 sprays into both nostrils daily. Patient taking differently: Place 2 sprays into both nostrils daily as needed for allergies. 12/09/22   Leath-Warren, Sadie Haber, NP  Garlic 100 MG TABS Take 100 mg by mouth 2 (two) times daily.    [provider]  ibuprofen (ADVIL) 600 MG tablet Take 1 tablet (600 mg total) by mouth every 6 (six) hours as needed for mild pain. 01/07/23   Jaymes Graff, MD  levothyroxine (SYNTHROID) 125 MCG tablet Take 1 tablet (125 mcg total) by mouth daily. Patient not taking: Reported on 02/26/2023 09/07/20   Romero Belling, MD  lidocaine (XYLOCAINE) 2 % solution Gargle and spit 5mL every 6 hours as needed for throat pain or discomfort. Patient not taking: Reported on 02/26/2023 02/21/23   Leath-Warren, Sadie Haber, NP   SYNTHROID 150 MCG tablet Take 150 mcg by mouth every morning. 02/20/23   [provider]  valsartan-hydrochlorothiazide (DIOVAN-HCT) 320-12.5 MG tablet Take 1 tablet by mouth daily.    [provider]  Vitamin D, Ergocalciferol, (DRISDOL) 1.25 MG (50000 UNIT) CAPS capsule Take 50,000 Units by mouth once a week. 11/09/22   [provider]    Family History Family History  Problem Relation Age of Onset   Breast cancer Mother    Heart disease Father    Thyroid disease Father    Hypothyroidism Sister    Lymphoma Brother    Diabetes Brother     Social History Social History   Tobacco Use   Smoking status: Never    Passive exposure: Past   Smokeless tobacco: Never  Vaping Use   Vaping status: Never Used  Substance Use Topics   Alcohol use: No    Alcohol/week: 0.0 standard drinks of alcohol   Drug use: No     Allergies   Patient has no known allergies.   Review of Systems Review of Systems Per HPI  Physical Exam Triage Vital Signs ED Triage Vitals [09/29/23 1248]  Encounter Vitals Group     BP (!) 143/86     Systolic BP Percentile      Diastolic BP Percentile      Pulse Rate 80     Resp 20     Temp 98.5 F (36.9 C)     Temp Source Oral     SpO2 95 %     Weight      Height      Head Circumference      Peak Flow      Pain Score 5     Pain Loc      Pain Education      Exclude from Growth Chart    No data found.  Updated Vital Signs BP (!) 143/86   Pulse 80   Temp 98.5 F (36.9 C) (Oral)   Resp 20   LMP 12/04/2022 (Exact Date) Comment: Urine pregnancy test negative on 01/05/23  SpO2 95%   Visual Acuity Right Eye Distance:   Left Eye Distance:   Bilateral Distance:    Right Eye Near:   Left Eye Near:    Bilateral Near:     Physical Exam Vitals and nursing note reviewed.  Constitutional:      General: She is not in acute distress.    Appearance: Normal appearance.  HENT:     Head: Normocephalic.     Right Ear:  Tympanic membrane, ear canal and external ear normal.     Left Ear:  Tympanic membrane, ear canal and external ear normal.     Nose: Congestion present.     Right Turbinates: Enlarged and swollen.     Left Turbinates: Enlarged and swollen.     Right Sinus: No maxillary sinus tenderness or frontal sinus tenderness.     Left Sinus: No maxillary sinus tenderness or frontal sinus tenderness.     Mouth/Throat:     Mouth: Mucous membranes are moist.  Eyes:     Extraocular Movements: Extraocular movements intact.     Conjunctiva/sclera: Conjunctivae normal.     Pupils: Pupils are equal, round, and reactive to light.  Cardiovascular:     Rate and Rhythm: Normal rate and regular rhythm.     Pulses: Normal pulses.     Heart sounds: Normal heart sounds.  Pulmonary:     Effort: Pulmonary effort is normal. No respiratory distress.     Breath sounds: Normal breath sounds. No stridor. No wheezing, rhonchi or rales.  Abdominal:     General: Bowel sounds are normal.     Palpations: Abdomen is soft.     Tenderness: There is no abdominal tenderness.  Musculoskeletal:     Cervical back: Normal range of motion.  Skin:    General: Skin is warm and dry.  Neurological:     General: No focal deficit present.     Mental Status: She is alert and oriented to person, place, and time.  Psychiatric:        Mood and Affect: Mood normal.        Behavior: Behavior normal.      UC Treatments / Results  Labs (all labs ordered are listed, but only abnormal results are displayed) Labs Reviewed - No data to display  EKG   Radiology No results found.  Procedures Procedures (including critical care time)  Medications Ordered in UC Medications - No data to display  Initial Impression / Assessment and Plan / UC Course  I have reviewed the triage vital signs and the nursing notes.  Pertinent labs & imaging results that were available during my care of the patient were reviewed by me and considered in my  medical decision making (see chart for details).  On exam, lung sounds are clear throughout, room air sats at 95%.  Patient with cough and chest congestion that has been present for the past 2 weeks.  Given patient's duration of symptoms, and underlying history of sarcoidosis, will treat for possible bacterial lower respiratory infection and cough with Augmentin 875/125 mg tablets and Promethazine DM for the cough.  Supportive care recommendations were provided and discussed with the patient to include fluids, rest, over-the-counter analgesics, and use of a humidifier at nighttime during sleep.  Discussed indications regarding follow-up.  Patient was in agreement with this plan of care and verbalizes understanding.  All questions were answered.  Patient stable for discharge.  Final Clinical Impressions(s) / UC Diagnoses   Final diagnoses:  Cough, unspecified type  Lower respiratory infection     Discharge Instructions      Take medication as prescribed. Increase fluids and allow for plenty of rest. Continue over-the-counter Tylenol or Ibuprofen as needed for pain, fever, general discomfort. May use normal saline nasal spray for nasal congestion and runny nose.  You may use the nasal spray you have at home to help with the nasal congestion and runny nose. For the cough, recommend using a humidifier in the bedroom at nighttime during sleep and sleeping elevated on pillows while cough symptoms  persist. Your cough may continue after you have completed the medication. If you feel well generally, continue OTC cough medication and cough drops. If you develop fever, wheezing, SOB or trouble breathing, follow-up with your PCP or in this clinic for further evaluation. Follow-up as needed.      ED Prescriptions     Medication Sig Dispense Auth. Provider   amoxicillin-clavulanate (AUGMENTIN) 875-125 MG tablet Take 1 tablet by mouth every 12 (twelve) hours. 14 tablet Leath-Warren, Sadie Haber, NP    promethazine-dextromethorphan (PROMETHAZINE-DM) 6.25-15 MG/5ML syrup Take 5 mLs by mouth 4 (four) times daily as needed. 118 mL Leath-Warren, Sadie Haber, NP      PDMP not reviewed this encounter.   Abran Cantor, NP 09/29/23 1350

## 2023-11-30 ENCOUNTER — Other Ambulatory Visit (HOSPITAL_COMMUNITY): Payer: Self-pay | Admitting: Family Medicine

## 2023-11-30 DIAGNOSIS — E7800 Pure hypercholesterolemia, unspecified: Secondary | ICD-10-CM

## 2023-12-15 ENCOUNTER — Ambulatory Visit (HOSPITAL_COMMUNITY)
Admission: RE | Admit: 2023-12-15 | Discharge: 2023-12-15 | Disposition: A | Discharge status: Home / Self Care | Payer: Self-pay | Source: Ambulatory Visit | Attending: Family Medicine | Admitting: Family Medicine

## 2023-12-15 DIAGNOSIS — E7800 Pure hypercholesterolemia, unspecified: Secondary | ICD-10-CM | POA: Insufficient documentation
